# Patient Record
Sex: Female | Born: 1995
Health system: Southern US, Community
[De-identification: ages and names within clinical notes are randomized; demographics above are authoritative.]

## PROBLEM LIST (undated history)

## (undated) DIAGNOSIS — D649 Anemia, unspecified: Secondary | ICD-10-CM

## (undated) DIAGNOSIS — I10 Essential (primary) hypertension: Secondary | ICD-10-CM

## (undated) DIAGNOSIS — R87619 Unspecified abnormal cytological findings in specimens from cervix uteri: Secondary | ICD-10-CM

## (undated) DIAGNOSIS — O24419 Gestational diabetes mellitus in pregnancy, unspecified control: Secondary | ICD-10-CM

## (undated) DIAGNOSIS — F4310 Post-traumatic stress disorder, unspecified: Secondary | ICD-10-CM

## (undated) DIAGNOSIS — J45909 Unspecified asthma, uncomplicated: Secondary | ICD-10-CM

## (undated) DIAGNOSIS — R87629 Unspecified abnormal cytological findings in specimens from vagina: Secondary | ICD-10-CM

## (undated) HISTORY — DX: Unspecified abnormal cytological findings in specimens from cervix uteri: R87.619

## (undated) HISTORY — PX: NO PAST SURGERIES: SHX2092

## (undated) HISTORY — DX: Anemia, unspecified: D64.9

## (undated) HISTORY — DX: Post-traumatic stress disorder, unspecified: F43.10

---

## 2019-05-23 DIAGNOSIS — Z3202 Encounter for pregnancy test, result negative: Secondary | ICD-10-CM | POA: Diagnosis not present

## 2019-07-20 ENCOUNTER — Encounter (HOSPITAL_COMMUNITY): Payer: Self-pay | Admitting: Obstetrics & Gynecology

## 2019-07-20 ENCOUNTER — Inpatient Hospital Stay (HOSPITAL_COMMUNITY)
Admission: AD | Admit: 2019-07-20 | Discharge: 2019-07-21 | Disposition: A | Discharge status: Home / Self Care | Payer: BC Managed Care – PPO | Attending: Obstetrics & Gynecology | Admitting: Obstetrics & Gynecology

## 2019-07-20 ENCOUNTER — Other Ambulatory Visit: Payer: Self-pay

## 2019-07-20 ENCOUNTER — Inpatient Hospital Stay (HOSPITAL_COMMUNITY): Payer: BC Managed Care – PPO

## 2019-07-20 ENCOUNTER — Ambulatory Visit (INDEPENDENT_AMBULATORY_CARE_PROVIDER_SITE_OTHER)
Admission: EM | Admit: 2019-07-20 | Discharge: 2019-07-20 | Disposition: A | Discharge status: Home / Self Care | Payer: BC Managed Care – PPO | Source: Home / Self Care | Attending: Family Medicine | Admitting: Family Medicine

## 2019-07-20 ENCOUNTER — Encounter (HOSPITAL_COMMUNITY): Payer: Self-pay | Admitting: Family Medicine

## 2019-07-20 DIAGNOSIS — O26891 Other specified pregnancy related conditions, first trimester: Secondary | ICD-10-CM | POA: Diagnosis not present

## 2019-07-20 DIAGNOSIS — O209 Hemorrhage in early pregnancy, unspecified: Secondary | ICD-10-CM | POA: Diagnosis not present

## 2019-07-20 DIAGNOSIS — Z3A01 Less than 8 weeks gestation of pregnancy: Secondary | ICD-10-CM | POA: Insufficient documentation

## 2019-07-20 DIAGNOSIS — O3680X0 Pregnancy with inconclusive fetal viability, not applicable or unspecified: Secondary | ICD-10-CM

## 2019-07-20 DIAGNOSIS — Z3202 Encounter for pregnancy test, result negative: Secondary | ICD-10-CM | POA: Diagnosis not present

## 2019-07-20 DIAGNOSIS — R109 Unspecified abdominal pain: Secondary | ICD-10-CM | POA: Diagnosis not present

## 2019-07-20 DIAGNOSIS — O039 Complete or unspecified spontaneous abortion without complication: Secondary | ICD-10-CM | POA: Diagnosis not present

## 2019-07-20 DIAGNOSIS — N939 Abnormal uterine and vaginal bleeding, unspecified: Secondary | ICD-10-CM

## 2019-07-20 DIAGNOSIS — Z3A00 Weeks of gestation of pregnancy not specified: Secondary | ICD-10-CM | POA: Diagnosis not present

## 2019-07-20 DIAGNOSIS — R1030 Lower abdominal pain, unspecified: Secondary | ICD-10-CM | POA: Insufficient documentation

## 2019-07-20 DIAGNOSIS — O4690 Antepartum hemorrhage, unspecified, unspecified trimester: Secondary | ICD-10-CM | POA: Diagnosis not present

## 2019-07-20 LAB — CBC
HCT: 39 % (ref 36.0–46.0)
Hemoglobin: 12.9 g/dL (ref 12.0–15.0)
MCH: 28.9 pg (ref 26.0–34.0)
MCHC: 33.1 g/dL (ref 30.0–36.0)
MCV: 87.4 fL (ref 80.0–100.0)
Platelets: 275 10*3/uL (ref 150–400)
RBC: 4.46 MIL/uL (ref 3.87–5.11)
RDW: 13 % (ref 11.5–15.5)
WBC: 7.7 10*3/uL (ref 4.0–10.5)
nRBC: 0 % (ref 0.0–0.2)

## 2019-07-20 LAB — HCG, QUANTITATIVE, PREGNANCY: hCG, Beta Chain, Quant, S: 16 m[IU]/mL — ABNORMAL HIGH (ref <5)

## 2019-07-20 LAB — POC URINE PREG, ED: Preg Test, Ur: NEGATIVE

## 2019-07-20 LAB — ABO/RH: ABO/RH(D): A POS

## 2019-07-20 NOTE — ED Triage Notes (Signed)
Pt c/o vaginal bleeding. + pregnancy test x 4 at home.

## 2019-07-20 NOTE — MAU Note (Addendum)
Found out last wk I was pregnant. Having some abd pain since last wk. Today also having back pain and pink/red spotting. Was seen at [facility] this evening. UPT negative and BHCG 16.

## 2019-07-20 NOTE — ED Provider Notes (Signed)
[facility]    CSN: [id number] Arrival date & time: 07/20/19  1813      History   Chief Complaint Chief Complaint  Patient presents with  . Vaginal Bleeding    HPI Cindy Fletcher is a 24 y.o. female.   She is she is presenting with vaginal bleeding.  It has been light in nature.  She has taken pregnancy test at home which have been positive.  She is having some mild discomfort in the suprapubic region.  She denies any change in bowel movements or voiding.  No prior history of ectopic pregnancy.  HPI  History reviewed. No pertinent past medical history.  There are no problems to display for this patient.   History reviewed. No pertinent surgical history.  OB History   No obstetric history on file.      Home Medications    Prior to Admission medications   Not on File    Family History History reviewed. No pertinent family history.  Social History Social History   Tobacco Use  . Smoking status: Not on file  Substance Use Topics  . Alcohol use: Not on file  . Drug use: Not on file     Allergies   Patient has no known allergies.   Review of Systems Review of Systems  See HPI  Physical Exam Triage Vital Signs ED Triage Vitals [07/20/19 1831]  Enc Vitals Group     BP (!) 152/89     Pulse Rate 82     Resp 16     Temp 98 F (36.7 C)     Temp src      SpO2 100 %     Weight      Height      Head Circumference      Peak Flow      Pain Score 1     Pain Loc      Pain Edu?      Excl. in GC?    No data found.  Updated Vital Signs BP (!) 152/89   Pulse 82   Temp 98 F (36.7 C)   Resp 16   LMP 06/15/2019   SpO2 100%   Visual Acuity Right Eye Distance:   Left Eye Distance:   Bilateral Distance:    Right Eye Near:   Left Eye Near:    Bilateral Near:     Physical Exam Gen: NAD, alert, cooperative with exam, well-appearing ENT: normal lips, normal nasal mucosa,  Eye: normal EOM, normal conjunctiva and lids CV:  no edema,  +2 pedal pulses   Resp: no accessory muscle use, non-labored,  GI: no masses or tenderness, no hernia soft, nondistended, Skin: no rashes, no areas of induration     UC Treatments / Results  Labs (all labs ordered are listed, but only abnormal results are displayed) Labs Reviewed  HCG, QUANTITATIVE, PREGNANCY - Abnormal; Notable for the following components:      Result Value   hCG, Beta Chain, Quant, S 16 (*)    All other components within normal limits  POC URINE PREG, ED    EKG   Radiology No results found.  Procedures Procedures (including critical care time)  Medications Ordered in UC Medications - No data to display  Initial Impression / Assessment and Plan / UC Course  I have reviewed the triage vital signs and the nursing notes.  Pertinent labs & imaging results that were available during my care of the patient were reviewed by  me and considered in my medical decision making (see chart for details).     Yevonne is a 24 year old that is presenting with vaginal bleeding in the first trimester pregnancy.  Initial pregnancy test here was negative.  Quantitative was demonstrating positivity.  She was called and informed to be seen in the MAU for the consideration of transvaginal ultrasound.  Due to the concern of ectopic pregnancy.  She was agreeable with this.  Final Clinical Impressions(s) / UC Diagnoses   Final diagnoses:  Vaginal bleeding     Discharge Instructions     I will call you with the results from tonight.     ED Prescriptions    None     PDMP not reviewed this encounter.   Rosemarie Ax, MD 07/20/19 2046

## 2019-07-20 NOTE — MAU Note (Signed)
Wynelle Bourgeois CNM talking with pt to discuss plan of care. Will do labs to determine ABO RH and then pt will have u/s. Pt agrees with POC

## 2019-07-20 NOTE — Discharge Instructions (Signed)
I will call you with the results from tonight.

## 2019-07-21 DIAGNOSIS — O3680X0 Pregnancy with inconclusive fetal viability, not applicable or unspecified: Secondary | ICD-10-CM | POA: Diagnosis not present

## 2019-07-21 DIAGNOSIS — O4690 Antepartum hemorrhage, unspecified, unspecified trimester: Secondary | ICD-10-CM

## 2019-07-21 DIAGNOSIS — O26891 Other specified pregnancy related conditions, first trimester: Secondary | ICD-10-CM

## 2019-07-21 DIAGNOSIS — Z3A00 Weeks of gestation of pregnancy not specified: Secondary | ICD-10-CM

## 2019-07-21 DIAGNOSIS — R109 Unspecified abdominal pain: Secondary | ICD-10-CM

## 2019-07-21 LAB — URINALYSIS, ROUTINE W REFLEX MICROSCOPIC
Bacteria, UA: NONE SEEN
Bilirubin Urine: NEGATIVE
Glucose, UA: NEGATIVE mg/dL
Ketones, ur: NEGATIVE mg/dL
Leukocytes,Ua: NEGATIVE
Nitrite: NEGATIVE
Protein, ur: NEGATIVE mg/dL
Specific Gravity, Urine: 1.02 (ref 1.005–1.030)
pH: 5 (ref 5.0–8.0)

## 2019-07-21 NOTE — MAU Note (Signed)
Wynelle Bourgeois CNM in Triage to discuss u/s results and poc with pt. PT will return Friday evening for repeat BHCG. [place]itten and verbal dc instructions given and pt voiced understanding

## 2019-07-21 NOTE — Discharge Instructions (Signed)

## 2019-07-21 NOTE — MAU Provider Note (Signed)
Chief Complaint: Vaginal Bleeding, Abdominal Pain, and Back Pain   Seen by provider at 2250      SUBJECTIVE HPI: Cindy Fletcher is a 24 y.o. G1P0 at Unknown by LMP who presents to maternity admissions reporting lower abdominal and back pain since last week.  Had some light red spotting today. .Just recently found out she was pregnant.  Was seen in Urgent Care today and sent here for [place].  She denies vaginal itching/burning, urinary symptoms, h/a, dizziness, n/v, or fever/chills.    Vaginal Bleeding The patient's primary symptoms include pelvic pain and vaginal bleeding. The patient's pertinent negatives include no genital itching, genital lesions or genital odor. This is a new problem. The current episode started in the past 7 days. The problem has been unchanged. The pain is mild. She is pregnant. Associated symptoms include abdominal pain and back pain. Pertinent negatives include no constipation, diarrhea, dysuria, fever or frequency. The vaginal discharge was bloody. The vaginal bleeding is spotting. She has not been passing clots. She has not been passing tissue. Nothing aggravates the symptoms. She has tried nothing for the symptoms.  Abdominal Pain This is a new problem. The current episode started in the past 7 days. The onset quality is gradual. The pain is located in the suprapubic region. The quality of the pain is cramping. Pertinent negatives include no constipation, diarrhea, dysuria, fever or frequency. Nothing aggravates the pain. The pain is relieved by nothing. She has tried nothing for the symptoms.  Back Pain This is a new problem. The current episode started in the past 7 days. The problem is unchanged. The pain is present in the lumbar spine. Associated symptoms include abdominal pain and pelvic pain. Pertinent negatives include no dysuria or fever. She has tried nothing for the symptoms.   RN Note: Found out last wk I was pregnant. Having some abd pain since last wk. Today  also having back pain and pink/red spotting. Was seen at [facility] this evening. UPT negative and BHCG 16.   No past medical history on file. No past surgical history on file. Social History   Socioeconomic History  . Marital status: Single    Spouse name: Not on file  . Number of children: Not on file  . Years of education: Not on file  . Highest education level: Not on file  Occupational History  . Not on file  Tobacco Use  . Smoking status: Not on file  Substance and Sexual Activity  . Alcohol use: Not on file  . Drug use: Not on file  . Sexual activity: Not on file  Other Topics Concern  . Not on file  Social History Narrative  . Not on file   Social Determinants of Health   Financial Resource Strain:   . Difficulty of Paying Living Expenses:   Food Insecurity:   . Worried About Charity fundraiser in the Last Year:   . Arboriculturist in the Last Year:   Transportation Needs:   . Film/video editor (Medical):   Marland Kitchen Lack of Transportation (Non-Medical):   Physical Activity:   . Days of Exercise per Week:   . Minutes of Exercise per Session:   Stress:   . Feeling of Stress :   Social Connections:   . Frequency of Communication with Friends and Family:   . Frequency of Social Gatherings with Friends and Family:   . Attends Religious Services:   . Active Member of Clubs or Organizations:   .  Attends Banker Meetings:   Marland Kitchen Marital Status:   Intimate Partner Violence:   . Fear of Current or Ex-Partner:   . Emotionally Abused:   Marland Kitchen Physically Abused:   . Sexually Abused:    No current facility-administered medications on file prior to encounter.   No current outpatient medications on file prior to encounter.   No Known Allergies  I have reviewed patient's Past Medical Hx, Surgical Hx, Family Hx, Social Hx, medications and allergies.   ROS:  Review of Systems  Constitutional: Negative for fever.  Gastrointestinal: Positive for abdominal  pain. Negative for constipation and diarrhea.  Genitourinary: Positive for pelvic pain and vaginal bleeding. Negative for dysuria and frequency.  Musculoskeletal: Positive for back pain.   Review of Systems  Other systems negative   Physical Exam  Physical Exam Patient Vitals for the past 24 hrs:  BP Temp Pulse Resp Height Weight  07/20/19 2245 140/79 98.6 F (37 C) 68 16 5' 8.5" (1.74 m) 103 kg   Constitutional: Well-developed, well-nourished female in no acute distress.  Cardiovascular: normal rate Respiratory: normal effort GI: Abd soft, non-tender. Pos BS x 4 MS: Extremities nontender, no edema, normal ROM Neurologic: Alert and oriented x 4.  GU: Neg CVAT.  PELVIC EXAM: Deferred, seen at Urgent Care  LAB RESULTS Results for orders placed or performed during the hospital encounter of 07/20/19 (from the past 24 hour(s))  Urinalysis, Routine w reflex microscopic     Status: Abnormal   Collection Time: 07/20/19 11:03 PM  Result Value Ref Range   Color, Urine YELLOW YELLOW   APPearance CLEAR CLEAR   Specific Gravity, Urine 1.020 1.005 - 1.030   pH 5.0 5.0 - 8.0   Glucose, UA NEGATIVE NEGATIVE mg/dL   Hgb urine dipstick MODERATE (A) NEGATIVE   Bilirubin Urine NEGATIVE NEGATIVE   Ketones, ur NEGATIVE NEGATIVE mg/dL   Protein, ur NEGATIVE NEGATIVE mg/dL   Nitrite NEGATIVE NEGATIVE   Leukocytes,Ua NEGATIVE NEGATIVE   RBC / HPF 0-5 0 - 5 RBC/hpf   WBC, UA 0-5 0 - 5 WBC/hpf   Bacteria, UA NONE SEEN NONE SEEN   Squamous Epithelial / LPF 0-5 0 - 5   Mucus PRESENT   CBC     Status: None   Collection Time: 07/20/19 11:30 PM  Result Value Ref Range   WBC 7.7 4.0 - 10.5 K/uL   RBC 4.46 3.87 - 5.11 MIL/uL   Hemoglobin 12.9 12.0 - 15.0 g/dL   HCT 03.7 04.8 - 88.9 %   MCV 87.4 80.0 - 100.0 fL   MCH 28.9 26.0 - 34.0 pg   MCHC 33.1 30.0 - 36.0 g/dL   RDW 16.9 45.0 - 38.8 %   Platelets 275 150 - 400 K/uL   nRBC 0.0 0.0 - 0.2 %  ABO/Rh     Status: None   Collection Time:  07/20/19 11:30 PM  Result Value Ref Range   ABO/RH(D) A POS    No rh immune globuloin      NOT A RH IMMUNE GLOBULIN CANDIDATE, PT RH POSITIVE Performed at [facility], [address], [place], [place] [address]    Quantitative HCG = 16 (Urgent Care)  --/--/A POS (05/05 2330)  IMAGING US OB LESS THAN 14 WEEKS WITH OB TRANSVAGINAL  Result Date: 07/21/2019 CLINICAL DATA:  Initial evaluation for acute vaginal bleeding, positive beta HCG. EXAM: OBSTETRIC <14 WK [place] AND TRANSVAGINAL OB US TECHNIQUE: Both transabdominal and transvaginal ultrasound examinations were performed  for complete evaluation of the gestation as well as the maternal uterus, adnexal regions, and pelvic cul-de-sac. Transvaginal technique was performed to assess early pregnancy. COMPARISON:  None. FINDINGS: Intrauterine gestational sac: Negative. Endometrial stripe measures 6 mm. Yolk sac:  Negative. Embryo:  Negative. Cardiac Activity: Negative. Heart Rate: N/A  bpm Subchorionic hemorrhage:  None visualized. Maternal uterus/adnexae: Ovaries are normal in appearance bilaterally. No adnexal mass or free fluid. IMPRESSION: 1. Early pregnancy with no discrete IUP or adnexal mass identified. Finding is consistent with a pregnancy of unknown anatomic location. Differential considerations include IUP to early to visualize, recent SAB, or possibly occult ectopic pregnancy. Close clinical monitoring with serial beta HCGs and close interval follow-up ultrasound recommended as clinically warranted. 2. No other acute maternal uterine or adnexal abnormality identified. Electronically Signed   By: Rise Mu M.D.   On: 07/21/2019 00:11    MAU Management/MDM: Ordered usual first trimester r/o ectopic labs.   Pelvic exam and cultures done at Urgent Care Will check baseline Ultrasound to rule out ectopic.  This bleeding/pain can represent a normal pregnancy with bleeding, spontaneous abortion or even an ectopic which can be  life-threatening.  The process as listed above helps to determine which of these is present.  Reviewed results with patient.  Discussed that with a low HCG, we would not expect to see anything on [place]. Thus we cannot rule out ectopic pregnancy just yet Recommend repeat HCG in 48 hours to see if it rises or fall.  Suspect that since she had positive UPT earlier and now is negative (because HCG is 16), this likely represents a failed pregnancy, but we cant be sure until we repeat the HCG   ASSESSMENT 1. Pregnancy of unknown anatomic location   2. Vaginal bleeding in pregnancy, first trimester     PLAN Discharge home Plan to repeat HCG level in 48 hours in MAU Will repeat  Ultrasound in about 7-10 days if HCG levels double appropriately  Ectopic precautions  Follow-up Information    [facility]. Go in 2 day(s).   Specialty: Obstetrics and Gynecology Contact information: [address] [id number] Wilhemina Bonito [place] [address] [phone]         Pt stable at time of discharge. Encouraged to return here or to other Urgent Care/ED if she develops worsening of symptoms, increase in pain, fever, or other concerning symptoms.    Wynelle Bourgeois CNM, MSN Certified Nurse-Midwife 07/21/2019  1:21 AM

## 2019-07-22 ENCOUNTER — Inpatient Hospital Stay (HOSPITAL_COMMUNITY)
Admission: AD | Admit: 2019-07-22 | Discharge: 2019-07-22 | Disposition: A | Discharge status: Home / Self Care | Payer: BC Managed Care – PPO | Attending: Obstetrics and Gynecology | Admitting: Obstetrics and Gynecology

## 2019-07-22 ENCOUNTER — Other Ambulatory Visit: Payer: Self-pay

## 2019-07-22 DIAGNOSIS — Z6790 Unspecified blood type, Rh positive: Secondary | ICD-10-CM | POA: Insufficient documentation

## 2019-07-22 DIAGNOSIS — O039 Complete or unspecified spontaneous abortion without complication: Secondary | ICD-10-CM | POA: Diagnosis not present

## 2019-07-22 LAB — HCG, QUANTITATIVE, PREGNANCY: hCG, Beta Chain, Quant, S: 5 m[IU]/mL — ABNORMAL HIGH (ref <5)

## 2019-07-22 NOTE — MAU Provider Note (Signed)
Subjective:  Cindy Fletcher is a 24 y.o. G1P0 at Unknown who presents today for FU BHCG. She was seen on 07/21/2019 around 1AM, but had blood drawn for hCG on 07/20/2019 @1902 . Results from that day show no IUP on , and HCG 16. Patient had a negative UPT at [facility] that day. She reports minimal vaginal bleeding. She denies abdominal or pelvic pain.   Objective:  Physical Exam  Nursing note and vitals reviewed.  Patient Vitals for the past 24 hrs:  BP Temp Temp src Pulse Resp SpO2 Height Weight  07/22/19 1841 (!) 146/57 98.4 F (36.9 C) Oral 73 16 100 % 5' 8.5" (1.74 m) 102.9 kg   Constitutional: She is oriented to person, place, and time. She appears well-developed and well-nourished. No distress.  HENT:  Head: Normocephalic.  Cardiovascular: Normal rate.  Respiratory: Effort normal.  GI: Soft. There is no tenderness.  Neurological: She is alert and oriented to person, place, and time. Skin: Skin is warm and dry.  Psychiatric: She has a normal mood and affect.   Results for orders placed or performed during the hospital encounter of 07/22/19 (from the past 24 hour(s))  hCG, quantitative, pregnancy     Status: Abnormal   Collection Time: 07/22/19  5:13 PM  Result Value Ref Range   hCG, Beta Chain, Quant, S 5 (H) <5 mIU/mL   Assessment/Plan: Miscarriage RH Positive HCG did not rise appropriately FU in 1 weeks for: repeat hCG and provider visit Message sent to [facility] to schedule Return MAU precautions given Pt discharged to home in stable condition  , 09/21/19, NP  6:58 PM 07/22/2019

## 2019-07-22 NOTE — Discharge Instructions (Signed)
Managing Pregnancy Loss Pregnancy loss can happen any time during a pregnancy. Often the cause is not known. It is rarely because of anything you did. Pregnancy loss in early pregnancy (during the first trimester) is called a miscarriage. This type of pregnancy loss is the most common. Pregnancy loss that happens after 20 weeks of pregnancy is called fetal demise if the baby's heart stops beating before birth. Fetal demise is much less common. Some women experience spontaneous labor shortly after fetal demise resulting in a stillborn birth (stillbirth). Any pregnancy loss can be devastating. You will need to recover both physically and emotionally. Most women are able to get pregnant again after a pregnancy loss and deliver a healthy baby. How to manage emotional recovery  Pregnancy loss is very hard emotionally. You may feel many different emotions while you grieve. You may feel sad and angry. You may also feel guilty. It is normal to have periods of crying. Emotional recovery can take longer than physical recovery. It is different for everyone. Taking these steps can help you in managing this loss:  Remember that it is unlikely you did anything to cause the pregnancy loss.  Share your thoughts and feelings with friends, family, and your partner. Remember that your partner is also recovering emotionally.  Make sure you have a good support system. Do not spend too much time alone.  Meet with a pregnancy loss counselor or join a pregnancy loss support group.  Get enough sleep and eat a healthy diet. Return to regular exercise when you have recovered physically.  Do not use drugs or alcohol to manage your emotions.  Consider seeing a mental health professional to help you recover emotionally.  Ask a friend or loved one to help you decide what to do with any clothing and nursery items you received for your baby. In the case of a stillbirth, many women benefit from taking additional steps in the  grieving process. You may want to:  Hold your baby after the birth.  Name your baby.  Request a birth certificate.  Create a keepsake such as handprints or footprints.  Dress your baby and have a picture taken.  Make funeral arrangements.  Ask for a baptism or blessing. Hospitals have staff members who can help you with all these arrangements. How to recognize emotional stress It is normal to have emotional stress after a pregnancy loss. But emotional stress that lasts a long time or becomes severe requires treatment. Watch out for these signs of severe emotional stress:  Sadness, anger, or guilt that is not going away and is interfering with your normal activities.  Relationship problems that have occurred or gotten worse since the pregnancy loss.  Signs of depression that last longer than 2 weeks. These may include: ? Sadness. ? Anxiety. ? Hopelessness. ? Loss of interest in activities you enjoy. ? Inability to concentrate. ? Trouble sleeping or sleeping too much. ? Loss of appetite or overeating. ? Thoughts of death or of hurting yourself. Follow these instructions at home:  Take over-the-counter and prescription medicines only as told by your health care provider.  Rest at home until your energy level returns. Return to your normal activities as told by your health care provider. Ask your health care provider what activities are safe for you.  When you are ready, meet with your health care provider to discuss steps to take for a future pregnancy.  Keep all follow-up visits as told by your health care provider. This is important.  Where to find support  To help you and your partner with the process of grieving, talk with your health care provider or seek counseling.  Consider meeting with others who have experienced pregnancy loss. Ask your health care provider about support groups and resources. Where to find more information  U.S. Department of Health and Holiday representative on Women's Health: [place]s.[place]  American Pregnancy Association: www.americanpregnancy.org Contact a health care provider if:  You continue to experience grief, sadness, or lack of motivation for everyday activities, and those feelings do not improve over time.  You are struggling to recover emotionally, especially if you are using alcohol or substances to help. Get help right away if:  You have thoughts of hurting yourself or others. If you ever feel like you may hurt yourself or others, or have thoughts about taking your own life, get help right away. You can go to your nearest emergency department or call:  Your local emergency services (911 in the [place]  A suicide crisis helpline, such as the [facility] at 541-055-2195. This is open 24 hours a day. Summary  Any pregnancy loss can be difficult physically and emotionally.  You may experience many different emotions while you grieve. Emotional recovery can last longer than physical recovery.  It is normal to have emotional stress after a pregnancy loss. But emotional stress that lasts a long time or becomes severe requires treatment.  See your health care provider if you are struggling emotionally after a pregnancy loss. This information is not intended to replace advice given to you by your health care provider. Make sure you discuss any questions you have with your health care provider. Document Revised: 06/23/2018 Document Reviewed: 05/14/2017 Elsevier Patient Education  2020 [place] A miscarriage is the loss of an unborn baby (fetus) before the 20th week of pregnancy. Most miscarriages happen during the first 3 months of pregnancy. Sometimes, a miscarriage can happen before a woman knows that she is pregnant. Having a miscarriage can be an emotional experience. If you have had a miscarriage, talk with your health care provider about any questions you  may have about miscarrying, the grieving process, and your plans for future pregnancy. What are the causes? A miscarriage may be caused by:  Problems with the genes or chromosomes of the fetus. These problems make it impossible for the baby to develop normally. They are often the result of random errors that occur early in the development of the baby, and are not passed from parent to child (not inherited).  Infection of the cervix or uterus.  Conditions that affect hormone balance in the body.  Problems with the cervix, such as the cervix opening and thinning before pregnancy is at term (cervical insufficiency).  Problems with the uterus. These may include: ? A uterus with an abnormal shape. ? Fibroids in the uterus. ? Congenital abnormalities. These are problems that were present at birth.  Certain medical conditions.  Smoking, drinking alcohol, or using drugs.  Injury (trauma). In many cases, the cause of a miscarriage is not known. What are the signs or symptoms? Symptoms of this condition include:  Vaginal bleeding or spotting, with or without cramps or pain.  Pain or cramping in the abdomen or lower back.  Passing fluid, tissue, or blood clots from the vagina. How is this diagnosed? This condition may be diagnosed based on:  A physical exam.  Ultrasound.  Blood tests.  Urine tests. How  is this treated? Treatment for a miscarriage is sometimes not necessary if you naturally pass all the tissue that was in your uterus. If necessary, this condition may be treated with:  Dilation and curettage (D&C). This is a procedure in which the cervix is stretched open and the lining of the uterus (endometrium) is scraped. This is done only if tissue from the fetus or placenta remains in the body (incomplete miscarriage).  Medicines, such as: ? Antibiotic medicine, to treat infection. ? Medicine to help the body pass any remaining tissue. ? Medicine to reduce (contract) the  size of the uterus. These medicines may be given if you have a lot of bleeding. If you have Rh negative blood and your baby was Rh positive, you will need a shot of a medicine called Rh immunoglobulinto protect your future babies from Rh blood problems. "Rh-negative" and "Rh-positive" refer to whether or not the blood has a specific protein found on the surface of red blood cells (Rh factor). Follow these instructions at home: Medicines   Take over-the-counter and prescription medicines only as told by your health care provider.  If you were prescribed antibiotic medicine, take it as told by your health care provider. Do not stop taking the antibiotic even if you start to feel better.  Do not take NSAIDs, such as aspirin and ibuprofen, unless they are approved by your health care provider. These medicines can cause bleeding. Activity  Rest as directed. Ask your health care provider what activities are safe for you.  Have someone help with home and family responsibilities during this time. General instructions  Keep track of the number of sanitary pads you use each day and how soaked (saturated) they are. Write down this information.  Monitor the amount of tissue or blood clots that you pass from your vagina. Save any large amounts of tissue for your health care provider to examine.  Do not use tampons, douche, or have sex until your health care provider approves.  To help you and your partner with the process of grieving, talk with your health care provider or seek counseling.  When you are ready, meet with your health care provider to discuss any important steps you should take for your health. Also, discuss steps you should take to have a healthy pregnancy in the future.  Keep all follow-up visits as told by your health care provider. This is important. Where to find more information  The American Congress of Obstetricians and Gynecologists: www.acog.org  U.S. Department of Health  and Cytogeneticist of Women's Health: http://hoffman.com/ Contact a health care provider if:  You have a fever or chills.  You have a foul smelling vaginal discharge.  You have more bleeding instead of less. Get help right away if:  You have severe cramps or pain in your back or abdomen.  You pass blood clots or tissue from your vagina that is walnut-sized or larger.  You soak more than 1 regular sanitary pad in an hour.  You become light-headed or weak.  You pass out.  You have feelings of sadness that take over your thoughts, or you have thoughts of hurting yourself. Summary  Most miscarriages happen in the first 3 months of pregnancy. Sometimes miscarriage happens before a woman even knows that she is pregnant.  Follow your health care provider's instruction for home care. Keep all follow-up appointments.  To help you and your partner with the process of grieving, talk with your health care provider or seek  counseling. This information is not intended to replace advice given to you by your health care provider. Make sure you discuss any questions you have with your health care provider. Document Revised: 06/25/2018 Document Reviewed: 04/08/2016 Elsevier Patient Education  [place].

## 2019-07-22 NOTE — MAU Note (Signed)
Here for repeat hormone level.  Had more bleeding yesterday, today has been minimal.  Denies pain.  Discussed hormone level and what it means, what we are looking for.

## 2019-08-01 ENCOUNTER — Ambulatory Visit: Payer: BC Managed Care – PPO | Admitting: Advanced Practice Midwife

## 2019-08-01 ENCOUNTER — Other Ambulatory Visit: Payer: Self-pay

## 2019-08-01 ENCOUNTER — Ambulatory Visit (INDEPENDENT_AMBULATORY_CARE_PROVIDER_SITE_OTHER): Payer: BC Managed Care – PPO | Admitting: Advanced Practice Midwife

## 2019-08-01 ENCOUNTER — Encounter: Payer: Self-pay | Admitting: Advanced Practice Midwife

## 2019-08-01 ENCOUNTER — Other Ambulatory Visit: Payer: BC Managed Care – PPO

## 2019-08-01 VITALS — BP 137/85 | HR 88 | Wt 229.0 lb

## 2019-08-01 DIAGNOSIS — O039 Complete or unspecified spontaneous abortion without complication: Secondary | ICD-10-CM | POA: Insufficient documentation

## 2019-08-01 NOTE — Progress Notes (Signed)
Pt states she does not want BC at this time.  Pt would like to know appropriate time to conceive.

## 2019-08-01 NOTE — Patient Instructions (Signed)
Managing Pregnancy Loss Pregnancy loss can happen any time during a pregnancy. Often the cause is not known. It is rarely because of anything you did. Pregnancy loss in early pregnancy (during the first trimester) is called a miscarriage. This type of pregnancy loss is the most common. Pregnancy loss that happens after 20 weeks of pregnancy is called fetal demise if the baby's heart stops beating before birth. Fetal demise is much less common. Some women experience spontaneous labor shortly after fetal demise resulting in a stillborn birth (stillbirth). Any pregnancy loss can be devastating. You will need to recover both physically and emotionally. Most women are able to get pregnant again after a pregnancy loss and deliver a healthy baby. How to manage emotional recovery  Pregnancy loss is very hard emotionally. You may feel many different emotions while you grieve. You may feel sad and angry. You may also feel guilty. It is normal to have periods of crying. Emotional recovery can take longer than physical recovery. It is different for everyone. Taking these steps can help you in managing this loss:  Remember that it is unlikely you did anything to cause the pregnancy loss.  Share your thoughts and feelings with friends, family, and your partner. Remember that your partner is also recovering emotionally.  Make sure you have a good support system. Do not spend too much time alone.  Meet with a pregnancy loss counselor or join a pregnancy loss support group.  Get enough sleep and eat a healthy diet. Return to regular exercise when you have recovered physically.  Do not use drugs or alcohol to manage your emotions.  Consider seeing a mental health professional to help you recover emotionally.  Ask a friend or loved one to help you decide what to do with any clothing and nursery items you received for your baby. In the case of a stillbirth, many women benefit from taking additional steps in the  grieving process. You may want to:  Hold your baby after the birth.  Name your baby.  Request a birth certificate.  Create a keepsake such as handprints or footprints.  Dress your baby and have a picture taken.  Make funeral arrangements.  Ask for a baptism or blessing. Hospitals have staff members who can help you with all these arrangements. How to recognize emotional stress It is normal to have emotional stress after a pregnancy loss. But emotional stress that lasts a long time or becomes severe requires treatment. Watch out for these signs of severe emotional stress:  Sadness, anger, or guilt that is not going away and is interfering with your normal activities.  Relationship problems that have occurred or gotten worse since the pregnancy loss.  Signs of depression that last longer than 2 weeks. These may include: ? Sadness. ? Anxiety. ? Hopelessness. ? Loss of interest in activities you enjoy. ? Inability to concentrate. ? Trouble sleeping or sleeping too much. ? Loss of appetite or overeating. ? Thoughts of death or of hurting yourself. Follow these instructions at home:  Take over-the-counter and prescription medicines only as told by your health care provider.  Rest at home until your energy level returns. Return to your normal activities as told by your health care provider. Ask your health care provider what activities are safe for you.  When you are ready, meet with your health care provider to discuss steps to take for a future pregnancy.  Keep all follow-up visits as told by your health care provider. This is important.   Where to find support  To help you and your partner with the process of grieving, talk with your health care provider or seek counseling.  Consider meeting with others who have experienced pregnancy loss. Ask your health care provider about support groups and resources. Where to find more information  U.S. Department of Health and Human  Services Office on Women's Health: www.womenshealth.gov  American Pregnancy Association: www.americanpregnancy.org Contact a health care provider if:  You continue to experience grief, sadness, or lack of motivation for everyday activities, and those feelings do not improve over time.  You are struggling to recover emotionally, especially if you are using alcohol or substances to help. Get help right away if:  You have thoughts of hurting yourself or others. If you ever feel like you may hurt yourself or others, or have thoughts about taking your own life, get help right away. You can go to your nearest emergency department or call:  Your local emergency services (911 in the [place]  A suicide crisis helpline, such as the National Suicide Prevention Lifeline at 1-800-273-8255. This is open 24 hours a day. Summary  Any pregnancy loss can be difficult physically and emotionally.  You may experience many different emotions while you grieve. Emotional recovery can last longer than physical recovery.  It is normal to have emotional stress after a pregnancy loss. But emotional stress that lasts a long time or becomes severe requires treatment.  See your health care provider if you are struggling emotionally after a pregnancy loss. This information is not intended to replace advice given to you by your health care provider. Make sure you discuss any questions you have with your health care provider. Document Revised: 06/23/2018 Document Reviewed: 05/14/2017 Elsevier Patient Education  2020 Elsevier Inc.  

## 2019-08-01 NOTE — Progress Notes (Signed)
  GYNECOLOGY PROGRESS NOTE  History:  24 y.o. G1P0 presents to [facility] office today for problem gyn visit. She reports no pain and no bleeding.  She denies h/a, dizziness, shortness of breath, n/v, or fever/chills.    The following portions of the patient's history were reviewed and updated as appropriate: allergies, current medications, past family history, past medical history, past social history, past surgical history and problem list.   Review of Systems:  Pertinent items are noted in HPI.   Objective:  Physical Exam Blood pressure 137/85, pulse 88, weight 229 lb (103.9 kg), last menstrual period 06/15/2019, unknown if currently breastfeeding. VS reviewed, nursing note reviewed,  Constitutional: well developed, well nourished, no distress HEENT: normocephalic CV: normal rate Pulm/chest wall: normal effort Breast Exam: deferred Abdomen: soft Neuro: alert and oriented x 3 Skin: warm, dry Psych: affect normal Pelvic exam: Deferred  Assessment & Plan:  1. Miscarriage --Pt doing physically and emotionally well.   --Plans another pregnancy so does not desire contraception at this time --Hcg was 5 on 07/22/19, no further labwork needed. --F/U PRN  Sharen Counter, CNM 4:23 PM

## 2019-09-29 ENCOUNTER — Ambulatory Visit (INDEPENDENT_AMBULATORY_CARE_PROVIDER_SITE_OTHER): Payer: 59

## 2019-09-29 ENCOUNTER — Other Ambulatory Visit: Payer: Self-pay

## 2019-09-29 VITALS — BP 132/84 | HR 89 | Wt 223.9 lb

## 2019-09-29 DIAGNOSIS — Z3201 Encounter for pregnancy test, result positive: Secondary | ICD-10-CM

## 2019-09-29 DIAGNOSIS — Z3200 Encounter for pregnancy test, result unknown: Secondary | ICD-10-CM

## 2019-09-29 LAB — POCT URINE PREGNANCY: Preg Test, Ur: POSITIVE — AB

## 2019-09-29 MED ORDER — VITAFOL GUMMIES 3.33-0.333-34.8 MG PO CHEW
3.0000 | CHEWABLE_TABLET | Freq: Every day | ORAL | 12 refills | Status: DC
Start: 2019-09-29 — End: 2020-10-23

## 2019-09-29 MED ORDER — BLOOD PRESSURE CUFF MISC
1.0000 | 0 refills | Status: DC
Start: 2019-09-29 — End: 2020-05-22

## 2019-09-29 NOTE — Progress Notes (Signed)
Patient was assessed and managed by nursing staff during this encounter. I have reviewed the chart and agree with the documentation and plan. I have also made any necessary editorial changes.  Warden Fillers, MD 09/29/2019 1:37 PM

## 2019-09-29 NOTE — Progress Notes (Signed)
Pt is here for UPT. UPT is positive. LMP 08/26/19, EDD 06/01/20. Advised pt that it is too early for [place] today, we will bring her back between 8-9 weeks for [place] for dating then schedule her new OB appt. Pt voices understanding.

## 2019-10-21 ENCOUNTER — Other Ambulatory Visit: Payer: Self-pay

## 2019-10-21 ENCOUNTER — Ambulatory Visit (INDEPENDENT_AMBULATORY_CARE_PROVIDER_SITE_OTHER): Payer: 59

## 2019-10-21 ENCOUNTER — Ambulatory Visit (INDEPENDENT_AMBULATORY_CARE_PROVIDER_SITE_OTHER): Payer: 59 | Admitting: Obstetrics and Gynecology

## 2019-10-21 VITALS — BP 147/85 | HR 101 | Ht 68.5 in | Wt 225.2 lb

## 2019-10-21 DIAGNOSIS — Z3687 Encounter for antenatal screening for uncertain dates: Secondary | ICD-10-CM | POA: Diagnosis not present

## 2019-10-21 DIAGNOSIS — O3680X0 Pregnancy with inconclusive fetal viability, not applicable or unspecified: Secondary | ICD-10-CM

## 2019-10-21 DIAGNOSIS — Z3A08 8 weeks gestation of pregnancy: Secondary | ICD-10-CM

## 2019-10-21 NOTE — Progress Notes (Signed)
  DATING AND VIABILITY SONOGRAM   Cindy Fletcher is a 24 y.o. year old G2P0010 with LMP Patient's last menstrual period was 08/26/2019. which would correlate to  [redacted]w[redacted]d weeks gestation.  She has regular menstrual cycles.   She is here today for a confirmatory initial sonogram.    GESTATION: [redacted]w[redacted]d SINGLETON     FETAL ACTIVITY:          Heart rate         176          The fetus is inactive.  Gestational criteria: Estimated Date of Delivery: 06/01/20 by early ultrasound now at [redacted]w[redacted]d  Previous Scans:0  GESTATIONAL SAC           36.4         [redacted]w[redacted]d  CROWN RUMP LENGTH           16         [redacted]w[redacted]d                                                                               AVERAGE EGA(BY THIS SCAN):  8wod by CRL   WORKING EDD( early ultrasound ):  06/01/20     TECHNICIAN COMMENTS:  Single live IUP at [redacted]w[redacted]d by CRL. FHR 176   A copy of this report including all images has been saved and backed up to a second source for retrieval if needed. All measures and details of the anatomical scan, placentation, fluid volume and pelvic anatomy are contained in that report.  Kadesia Robel J Sadia Belfiore 10/21/2019 9:32 AM

## 2019-10-21 NOTE — Progress Notes (Unsigned)
DATING AND VIABILITY SONOGRAM   Cindy Fletcher is a 24 y.o. year old G2P0010 with LMP Patient's last menstrual period was 08/26/2019. which would correlate to  [redacted]w[redacted]d weeks gestation.  She has regular menstrual cycles.   She is here today for a confirmatory initial sonogram.    GESTATION: [redacted]w[redacted]d SINGLETON     FETAL ACTIVITY:          Heart rate         176          The fetus is inactive.  Gestational criteria: Estimated Date of Delivery: 06/01/20 by early ultrasound now at [redacted]w[redacted]d  Previous Scans:0  GESTATIONAL SAC           36.4         [redacted]w[redacted]d  CROWN RUMP LENGTH           16         [redacted]w[redacted]d                                                                               AVERAGE EGA(BY THIS SCAN):  8wod by CRL   WORKING EDD( early ultrasound ):  06/01/20     TECHNICIAN COMMENTS:  Single live IUP at [redacted]w[redacted]d by CRL. FHR 176   A copy of this report including all images has been saved and backed up to a second source for retrieval if needed. All measures and details of the anatomical scan, placentation, fluid volume and pelvic anatomy are contained in that report.  Cindy Fletcher J Cindy Fletcher 10/21/2019 9:32 AM

## 2019-10-21 NOTE — Progress Notes (Signed)
Patient was assessed and managed by nursing staff during this encounter. I have reviewed the chart and agree with the documentation and plan. I have also made any necessary editorial changes.  Cindy Fletcher A Rameses Ou, MD 10/21/2019 10:16 AM   

## 2019-10-21 NOTE — Progress Notes (Signed)
Patient was assessed and managed by nursing staff during this encounter. I have reviewed the chart and agree with the documentation and plan. I have also made any necessary editorial changes.  Ephriam Turman A Aracelie Addis, MD 10/21/2019 10:16 AM   

## 2019-11-07 ENCOUNTER — Encounter: Payer: Self-pay | Admitting: Obstetrics

## 2019-11-07 ENCOUNTER — Encounter: Payer: Self-pay | Admitting: Advanced Practice Midwife

## 2019-11-07 ENCOUNTER — Ambulatory Visit (INDEPENDENT_AMBULATORY_CARE_PROVIDER_SITE_OTHER): Payer: BC Managed Care – PPO | Admitting: Advanced Practice Midwife

## 2019-11-07 ENCOUNTER — Other Ambulatory Visit: Payer: Self-pay

## 2019-11-07 ENCOUNTER — Other Ambulatory Visit (HOSPITAL_COMMUNITY)
Admission: RE | Admit: 2019-11-07 | Discharge: 2019-11-07 | Disposition: A | Discharge status: Home / Self Care | Payer: 59 | Source: Ambulatory Visit | Attending: Advanced Practice Midwife | Admitting: Advanced Practice Midwife

## 2019-11-07 VITALS — BP 129/82 | HR 94 | Wt 233.0 lb

## 2019-11-07 DIAGNOSIS — Z3480 Encounter for supervision of other normal pregnancy, unspecified trimester: Secondary | ICD-10-CM | POA: Diagnosis not present

## 2019-11-07 DIAGNOSIS — Z3A10 10 weeks gestation of pregnancy: Secondary | ICD-10-CM

## 2019-11-07 DIAGNOSIS — O219 Vomiting of pregnancy, unspecified: Secondary | ICD-10-CM

## 2019-11-07 DIAGNOSIS — N883 Incompetence of cervix uteri: Secondary | ICD-10-CM

## 2019-11-07 DIAGNOSIS — Z315 Encounter for genetic counseling: Secondary | ICD-10-CM | POA: Diagnosis not present

## 2019-11-07 DIAGNOSIS — Z3481 Encounter for supervision of other normal pregnancy, first trimester: Secondary | ICD-10-CM | POA: Diagnosis not present

## 2019-11-07 DIAGNOSIS — O0990 Supervision of high risk pregnancy, unspecified, unspecified trimester: Secondary | ICD-10-CM | POA: Insufficient documentation

## 2019-11-07 DIAGNOSIS — Z3490 Encounter for supervision of normal pregnancy, unspecified, unspecified trimester: Secondary | ICD-10-CM

## 2019-11-07 NOTE — Patient Instructions (Signed)
First Trimester of Pregnancy The first trimester of pregnancy is from week 1 until the end of week 13 (months 1 through 3). A week after a sperm fertilizes an egg, the egg will implant on the wall of the uterus. This embryo will begin to develop into a baby. Genes from you and your partner will form the baby. The female genes will determine whether the baby will be a boy or a girl. At 6-8 weeks, the eyes and face will be formed, and the heartbeat can be seen on ultrasound. At the end of 12 weeks, all the baby's organs will be formed. Now that you are pregnant, you will want to do everything you can to have a healthy baby. Two of the most important things are to get good prenatal care and to follow your health care provider's instructions. Prenatal care is all the medical care you receive before the baby's birth. This care will help prevent, find, and treat any problems during the pregnancy and childbirth. Body changes during your first trimester Your body goes through many changes during pregnancy. The changes vary from woman to woman.  You may gain or lose a couple of pounds at first.  You may feel sick to your stomach (nauseous) and you may throw up (vomit). If the vomiting is uncontrollable, call your health care provider.  You may tire easily.  You may develop headaches that can be relieved by medicines. All medicines should be approved by your health care provider.  You may urinate more often. Painful urination may mean you have a bladder infection.  You may develop heartburn as a result of your pregnancy.  You may develop constipation because certain hormones are causing the muscles that push stool through your intestines to slow down.  You may develop hemorrhoids or swollen veins (varicose veins).  Your breasts may begin to grow larger and become tender. Your nipples may stick out more, and the tissue that surrounds them (areola) may become darker.  Your gums may bleed and may be  sensitive to brushing and flossing.  Dark spots or blotches (chloasma, mask of pregnancy) may develop on your face. This will likely fade after the baby is born.  Your menstrual periods will stop.  You may have a loss of appetite.  You may develop cravings for certain kinds of food.  You may have changes in your emotions from day to day, such as being excited to be pregnant or being concerned that something may go wrong with the pregnancy and baby.  You may have more vivid and strange dreams.  You may have changes in your hair. These can include thickening of your hair, rapid growth, and changes in texture. Some women also have hair loss during or after pregnancy, or hair that feels dry or thin. Your hair will most likely return to normal after your baby is born. What to expect at prenatal visits During a routine prenatal visit:  You will be weighed to make sure you and the baby are growing normally.  Your blood pressure will be taken.  Your abdomen will be measured to track your baby's growth.  The fetal heartbeat will be listened to between weeks 10 and 14 of your pregnancy.  Test results from any previous visits will be discussed. Your health care provider may ask you:  How you are feeling.  If you are feeling the baby move.  If you have had any abnormal symptoms, such as leaking fluid, bleeding, severe headaches, or abdominal   cramping.  If you are using any tobacco products, including cigarettes, chewing tobacco, and electronic cigarettes.  If you have any questions. Other tests that may be performed during your first trimester include:  Blood tests to find your blood type and to check for the presence of any previous infections. The tests will also be used to check for low iron levels (anemia) and protein on red blood cells (Rh antibodies). Depending on your risk factors, or if you previously had diabetes during pregnancy, you may have tests to check for high blood sugar  that affects pregnant women (gestational diabetes).  Urine tests to check for infections, diabetes, or protein in the urine.  An ultrasound to confirm the proper growth and development of the baby.  Fetal screens for spinal cord problems (spina bifida) and Down syndrome.  HIV (human immunodeficiency virus) testing. Routine prenatal testing includes screening for HIV, unless you choose not to have this test.  You may need other tests to make sure you and the baby are doing well. Follow these instructions at home: Medicines  Follow your health care provider's instructions regarding medicine use. Specific medicines may be either safe or unsafe to take during pregnancy.  Take a prenatal vitamin that contains at least 600 micrograms (mcg) of folic acid.  If you develop constipation, try taking a stool softener if your health care provider approves. Eating and drinking   Eat a balanced diet that includes fresh fruits and vegetables, whole grains, good sources of protein such as meat, eggs, or tofu, and low-fat dairy. Your health care provider will help you determine the amount of weight gain that is right for you.  Avoid raw meat and uncooked cheese. These carry germs that can cause birth defects in the baby.  Eating four or five small meals rather than three large meals a day may help relieve nausea and vomiting. If you start to feel nauseous, eating a few soda crackers can be helpful. Drinking liquids between meals, instead of during meals, also seems to help ease nausea and vomiting.  Limit foods that are high in fat and processed sugars, such as fried and sweet foods.  To prevent constipation: ? Eat foods that are high in fiber, such as fresh fruits and vegetables, whole grains, and beans. ? Drink enough fluid to keep your urine clear or pale yellow. Activity  Exercise only as directed by your health care provider. Most women can continue their usual exercise routine during  pregnancy. Try to exercise for 30 minutes at least 5 days a week. Exercising will help you: ? Control your weight. ? Stay in shape. ? Be prepared for labor and delivery.  Experiencing pain or cramping in the lower abdomen or lower back is a good sign that you should stop exercising. Check with your health care provider before continuing with normal exercises.  Try to avoid standing for long periods of time. Move your legs often if you must stand in one place for a long time.  Avoid heavy lifting.  Wear low-heeled shoes and practice good posture.  You may continue to have sex unless your health care provider tells you not to. Relieving pain and discomfort  Wear a good support bra to relieve breast tenderness.  Take warm sitz baths to soothe any pain or discomfort caused by hemorrhoids. Use hemorrhoid cream if your health care provider approves.  Rest with your legs elevated if you have leg cramps or low back pain.  If you develop varicose veins in   your legs, wear support hose. Elevate your feet for 15 minutes, 3-4 times a day. Limit salt in your diet. Prenatal care  Schedule your prenatal visits by the twelfth week of pregnancy. They are usually scheduled monthly at first, then more often in the last 2 months before delivery.  Write down your questions. Take them to your prenatal visits.  Keep all your prenatal visits as told by your health care provider. This is important. Safety  Wear your seat belt at all times when driving.  Make a list of emergency phone numbers, including numbers for family, friends, the hospital, and police and fire departments. General instructions  Ask your health care provider for a referral to a local prenatal education class. Begin classes no later than the beginning of month 6 of your pregnancy.  Ask for help if you have counseling or nutritional needs during pregnancy. Your health care provider can offer advice or refer you to specialists for help  with various needs.  Do not use hot tubs, steam rooms, or saunas.  Do not douche or use tampons or scented sanitary pads.  Do not cross your legs for long periods of time.  Avoid cat litter boxes and soil used by cats. These carry germs that can cause birth defects in the baby and possibly loss of the fetus by miscarriage or stillbirth.  Avoid all smoking, herbs, alcohol, and medicines not prescribed by your health care provider. Chemicals in these products affect the formation and growth of the baby.  Do not use any products that contain nicotine or tobacco, such as cigarettes and e-cigarettes. If you need help quitting, ask your health care provider. You may receive counseling support and other resources to help you quit.  Schedule a dentist appointment. At home, brush your teeth with a soft toothbrush and be gentle when you floss. Contact a health care provider if:  You have dizziness.  You have mild pelvic cramps, pelvic pressure, or nagging pain in the abdominal area.  You have persistent nausea, vomiting, or diarrhea.  You have a bad smelling vaginal discharge.  You have pain when you urinate.  You notice increased swelling in your face, hands, legs, or ankles.  You are exposed to fifth disease or chickenpox.  You are exposed to German measles (rubella) and have never had it. Get help right away if:  You have a fever.  You are leaking fluid from your vagina.  You have spotting or bleeding from your vagina.  You have severe abdominal cramping or pain.  You have rapid weight gain or loss.  You vomit blood or material that looks like coffee grounds.  You develop a severe headache.  You have shortness of breath.  You have any kind of trauma, such as from a fall or a car accident. Summary  The first trimester of pregnancy is from week 1 until the end of week 13 (months 1 through 3).  Your body goes through many changes during pregnancy. The changes vary from  woman to woman.  You will have routine prenatal visits. During those visits, your health care provider will examine you, discuss any test results you may have, and talk with you about how you are feeling. This information is not intended to replace advice given to you by your health care provider. Make sure you discuss any questions you have with your health care provider. Document Revised: 02/13/2017 Document Reviewed: 02/13/2016 Elsevier Patient Education  2020 Elsevier Inc.  

## 2019-11-07 NOTE — Progress Notes (Signed)
NOB  Genetic Screening: Desires and wants to know gender. Pt given compassion care form and [facility] contact card.   Last Pap: per pt when she was 21.  CC: Sharp pain /abdominal and nausea vomiting sometimes. HA's no visual changes.  Fatigue and not sleeping well at night.  Pt only taking PNV's. Pt was told she would not be able to carry term from previous OB/GYN from [place] after doing a pap when she was 21 that she had a small cervix.   *B/P cuff not working will be sure get B/P before pt leaves.

## 2019-11-07 NOTE — Progress Notes (Signed)
Subjective:   Cindy Fletcher is a 24 y.o. G2P0010 at [redacted]w[redacted]d by LMP being seen today for her first obstetrical visit.  Her obstetrical history is significant for none, short cervix noted in a nonpregnant exam by previous Ob/Gyn and has Miscarriage and Supervision of normal pregnancy, antepartum on their problem list.. Patient does intend to breast feed. Pregnancy history fully reviewed.  Patient reports nausea and vomiting.  HISTORY: OB History  Gravida Para Term Preterm AB Living  2 0 0 0 1 0  SAB TAB Ectopic Multiple Live Births  1 0 0 0 0    # Outcome Date GA Lbr Len/2nd Weight Sex Delivery Anes PTL Lv  2 Current           1 SAB 07/20/19           History reviewed. No pertinent past medical history. History reviewed. No pertinent surgical history. History reviewed. No pertinent family history. Social History   Tobacco Use  . Smoking status: Never Smoker  . Smokeless tobacco: Never Used  Vaping Use  . Vaping Use: Never used  Substance Use Topics  . Alcohol use: Not Currently  . Drug use: Not Currently   No Known Allergies Current Outpatient Medications on File Prior to Visit  Medication Sig Dispense Refill  . Prenatal Vit-Fe Phos-FA-Omega (VITAFOL GUMMIES) 3.33-0.333-34.8 MG CHEW Chew 3 each by mouth daily. 90 tablet 12  . Blood Pressure Monitoring (BLOOD PRESSURE CUFF) MISC 1 Device by Does not apply route once a week. (Patient not taking: Reported on 11/07/2019) 1 each 0   No current facility-administered medications on file prior to visit.    Indications for ASA therapy (per uptodate) One of the following: Previous pregnancy with preeclampsia, especially early onset and with an adverse outcome No Multifetal gestation No Chronic hypertension No Type 1 or 2 diabetes mellitus No Chronic kidney disease No Autoimmune disease (antiphospholipid syndrome, systemic lupus erythematosus) No   Two or more of the following: Nulliparity Yes Obesity (body mass index >30  kg/m2) No Family history of preeclampsia in mother or sister No Age ?35 years No Sociodemographic characteristics (African American race, low socioeconomic level) No Personal risk factors (eg, previous pregnancy with low birth weight or small for gestational age infant, previous adverse pregnancy outcome [eg, stillbirth], interval >10 years between pregnancies) No   Indications for early 1 hour GTT (per uptodate)  BMI >25 (>23 in Asian women) AND one of the following  Gestational diabetes mellitus in a previous pregnancy No Glycated hemoglobin ?5.7 percent (39 mmol/mol), impaired glucose tolerance, or impaired fasting glucose on previous testing No First-degree relative with diabetes No High-risk race/ethnicity (eg, African American, Latino, Native American, [place] American, Pacific Islander) No History of cardiovascular disease No Hypertension or on therapy for hypertension No High-density lipoprotein cholesterol level <35 mg/dL (5.05 mmol/L) and/or a triglyceride level >250 mg/dL (3.97 mmol/L) No Polycystic ovary syndrome No Physical inactivity No Other clinical condition associated with insulin resistance (eg, severe obesity, acanthosis nigricans) No Previous birth of an infant weighing ?4000 g No Previous stillbirth of unknown cause No Exam   Vitals:   11/07/19 1318  Weight: 233 lb (105.7 kg)      Uterus:     Pelvic Exam: Perineum: no hemorrhoids, normal perineum   Vulva: normal external genitalia, no lesions   Vagina:  normal mucosa, normal discharge   Cervix: no lesions and normal, pap smear done.    Adnexa: normal adnexa and no mass, fullness, tenderness  Bony Pelvis: average  System: General: well-developed, well-nourished female in no acute distress   Breast:  normal appearance, no masses or tenderness   Skin: normal coloration and turgor, no rashes   Neurologic: oriented, normal, negative, normal mood   Extremities: normal strength, tone, and muscle mass, ROM of all  joints is normal   HEENT PERRLA, extraocular movement intact and sclera clear, anicteric   Mouth/Teeth mucous membranes moist, pharynx normal without lesions and dental hygiene good   Neck supple and no masses   Cardiovascular: regular rate and rhythm   Respiratory:  no respiratory distress, normal breath sounds   Abdomen: soft, non-tender; bowel sounds normal; no masses,  no organomegaly     Assessment:   Pregnancy: G2P0010 Patient Active Problem List   Diagnosis Date Noted  . Supervision of normal pregnancy, antepartum 11/07/2019  . Miscarriage 08/01/2019     Plan:  1. Supervision of other normal pregnancy, antepartum --Anticipatory guidance about next visits/weeks of pregnancy given. --Next visit in 4 weeks in office for AFP  - CBC/D/Plt+RPR+Rh+ABO+Rub Ab... - Culture, OB Urine - Genetic Screening - Cervicovaginal ancillary only( [place]) - Cytology - PAP - Enroll Patient in Babyscripts  2. [redacted] weeks gestation of pregnancy   3. Nausea and vomiting during pregnancy prior to [redacted] weeks gestation --Pt works swing shift, discussed taking medication at bedtime, whether that is day or night, but allowing 6-8 hours before working since it does cause drowsiness until she adjusts to it.  Pt will be on second shift next week, and will be able to take medication at 11 pm to sleep, which will be easier.  - Doxylamine-Pyridoxine (DICLEGIS) 10-10 MG TBEC; Take 2 tabs at bedtime. If needed, add another tab in the morning. If needed, add another tab in the afternoon, up to 4 tabs/day.  Dispense: 100 tablet; Refill: 5  5. Short cervix in non-gravid uterus --Pt was told by OB/Gyn in [place] that her cervix was short at a nonpregnant exam and that she would likely have preterm labor in the future.  Her cervix does appear short visually but otherwise normal on today's pelvic/Pap. --Will obtain baseline cervical length at 14 weeks or appropriate GA per MFM  - [place] MFM OB Transvaginal;  Future  Initial labs drawn. Continue prenatal vitamins. Discussed and offered genetic screening options, including Quad screen/AFP, NIPS testing, and option to decline testing. Benefits/risks/alternatives reviewed. Pt aware that anatomy US is form of genetic screening with lower accuracy in detecting trisomies than blood work.  Pt chooses genetic screening today. NIPS: ordered. Ultrasound discussed; fetal anatomic survey: requested. Problem list reviewed and updated. The nature of [facility] with multiple MDs and other Advanced Practice Providers was explained to patient; also emphasized that residents, students are part of our team. Routine obstetric precautions reviewed. No follow-ups on file.   Sharen Counter, CNM 11/07/19 1:47 PM

## 2019-11-08 DIAGNOSIS — O219 Vomiting of pregnancy, unspecified: Secondary | ICD-10-CM | POA: Insufficient documentation

## 2019-11-08 DIAGNOSIS — N883 Incompetence of cervix uteri: Secondary | ICD-10-CM | POA: Insufficient documentation

## 2019-11-08 LAB — CBC/D/PLT+RPR+RH+ABO+RUB AB...
Antibody Screen: NEGATIVE
Basophils Absolute: 0 10*3/uL (ref 0.0–0.2)
Basos: 0 %
EOS (ABSOLUTE): 0 10*3/uL (ref 0.0–0.4)
Eos: 0 %
HCV Ab: <0.1 s/co ratio (ref 0.0–0.9)
HIV Screen 4th Generation wRfx: NONREACTIVE
Hematocrit: 38.9 % (ref 34.0–46.6)
Hemoglobin: 12.5 g/dL (ref 11.1–15.9)
Hepatitis B Surface Ag: NEGATIVE
Immature Grans (Abs): 0 10*3/uL (ref 0.0–0.1)
Immature Granulocytes: 0 %
Lymphocytes Absolute: 2.1 10*3/uL (ref 0.7–3.1)
Lymphs: 21 %
MCH: 29.7 pg (ref 26.6–33.0)
MCHC: 32.1 g/dL (ref 31.5–35.7)
MCV: 92 fL (ref 79–97)
Monocytes Absolute: 0.6 10*3/uL (ref 0.1–0.9)
Monocytes: 6 %
Neutrophils Absolute: 7.2 10*3/uL — ABNORMAL HIGH (ref 1.4–7.0)
Neutrophils: 73 %
Platelets: 288 10*3/uL (ref 150–450)
RBC: 4.21 x10E6/uL (ref 3.77–5.28)
RDW: 13.7 % (ref 11.7–15.4)
RPR Ser Ql: NONREACTIVE
Rh Factor: POSITIVE
Rubella Antibodies, IGG: 9.89 index (ref >0.99)
WBC: 10 10*3/uL (ref 3.4–10.8)

## 2019-11-08 LAB — CERVICOVAGINAL ANCILLARY ONLY
Bacterial Vaginitis (gardnerella): NEGATIVE
Candida Glabrata: NEGATIVE
Candida Vaginitis: NEGATIVE
Chlamydia: NEGATIVE
Comment: NEGATIVE
Comment: NEGATIVE
Comment: NEGATIVE
Comment: NEGATIVE
Comment: NEGATIVE
Comment: NORMAL
Neisseria Gonorrhea: NEGATIVE
Trichomonas: NEGATIVE

## 2019-11-08 LAB — HCV INTERPRETATION

## 2019-11-08 MED ORDER — DOXYLAMINE-PYRIDOXINE 10-10 MG PO TBEC: DELAYED_RELEASE_TABLET | ORAL | 5 refills | Status: DC

## 2019-11-09 LAB — URINE CULTURE, OB REFLEX

## 2019-11-09 LAB — CULTURE, OB URINE

## 2019-11-11 ENCOUNTER — Encounter (HOSPITAL_COMMUNITY): Payer: Self-pay | Admitting: Obstetrics & Gynecology

## 2019-11-11 ENCOUNTER — Other Ambulatory Visit: Payer: Self-pay

## 2019-11-11 ENCOUNTER — Inpatient Hospital Stay (HOSPITAL_COMMUNITY)
Admission: AD | Admit: 2019-11-11 | Discharge: 2019-11-11 | Disposition: A | Discharge status: Home / Self Care | Payer: 59 | Attending: Obstetrics & Gynecology | Admitting: Obstetrics & Gynecology

## 2019-11-11 DIAGNOSIS — O10911 Unspecified pre-existing hypertension complicating pregnancy, first trimester: Secondary | ICD-10-CM | POA: Diagnosis not present

## 2019-11-11 DIAGNOSIS — O99511 Diseases of the respiratory system complicating pregnancy, first trimester: Secondary | ICD-10-CM | POA: Diagnosis not present

## 2019-11-11 DIAGNOSIS — O161 Unspecified maternal hypertension, first trimester: Secondary | ICD-10-CM | POA: Diagnosis not present

## 2019-11-11 DIAGNOSIS — O99891 Other specified diseases and conditions complicating pregnancy: Secondary | ICD-10-CM

## 2019-11-11 DIAGNOSIS — J45909 Unspecified asthma, uncomplicated: Secondary | ICD-10-CM | POA: Insufficient documentation

## 2019-11-11 DIAGNOSIS — Z3A11 11 weeks gestation of pregnancy: Secondary | ICD-10-CM

## 2019-11-11 DIAGNOSIS — M549 Dorsalgia, unspecified: Secondary | ICD-10-CM | POA: Insufficient documentation

## 2019-11-11 DIAGNOSIS — O26891 Other specified pregnancy related conditions, first trimester: Secondary | ICD-10-CM | POA: Diagnosis not present

## 2019-11-11 HISTORY — DX: Unspecified asthma, uncomplicated: J45.909

## 2019-11-11 LAB — CYTOLOGY - PAP
Comment: NEGATIVE
Diagnosis: UNDETERMINED — AB
High risk HPV: NEGATIVE

## 2019-11-11 LAB — URINALYSIS, ROUTINE W REFLEX MICROSCOPIC
Bilirubin Urine: NEGATIVE
Glucose, UA: NEGATIVE mg/dL
Hgb urine dipstick: NEGATIVE
Ketones, ur: NEGATIVE mg/dL
Leukocytes,Ua: NEGATIVE
Nitrite: NEGATIVE
Protein, ur: NEGATIVE mg/dL
Specific Gravity, Urine: 1.015 (ref 1.005–1.030)
pH: 7 (ref 5.0–8.0)

## 2019-11-11 MED ORDER — CYCLOBENZAPRINE HCL 10 MG PO TABS
10.0000 mg | ORAL_TABLET | Freq: Three times a day (TID) | ORAL | 0 refills | Status: DC | PRN
Start: 2019-11-11 — End: 2020-05-08

## 2019-11-11 NOTE — Discharge Instructions (Signed)
Acute Back Pain, Adult Acute back pain is sudden and usually short-lived. It is often caused by an injury to the muscles and tissues in the back. The injury may result from:  A muscle or ligament getting overstretched or torn (strained). Ligaments are tissues that connect bones to each other. Lifting something improperly can cause a back strain.  Wear and tear (degeneration) of the spinal disks. Spinal disks are circular tissue that provides cushioning between the bones of the spine (vertebrae).  Twisting motions, such as while playing sports or doing yard work.  A hit to the back.  Arthritis. You may have a physical exam, lab tests, and imaging tests to find the cause of your pain. Acute back pain usually goes away with rest and home care. Follow these instructions at home: Managing pain, stiffness, and swelling  Take over-the-counter and prescription medicines only as told by your health care provider.  Your health care provider may recommend applying ice during the first 24-48 hours after your pain starts. To do this: ? Put ice in a plastic bag. ? Place a towel between your skin and the bag. ? Leave the ice on for 20 minutes, 2-3 times a day.  If directed, apply heat to the affected area as often as told by your health care provider. Use the heat source that your health care provider recommends, such as a moist heat pack or a heating pad. ? Place a towel between your skin and the heat source. ? Leave the heat on for 20-30 minutes. ? Remove the heat if your skin turns bright red. This is especially important if you are unable to feel pain, heat, or cold. You have a greater risk of getting burned. Activity   Do not stay in bed. Staying in bed for more than 1-2 days can delay your recovery.  Sit up and stand up straight. Avoid leaning forward when you sit, or hunching over when you stand. ? If you work at a desk, sit close to it so you do not need to lean over. Keep your chin tucked  in. Keep your neck drawn back, and keep your elbows bent at a right angle. Your arms should look like the letter "L." ? Sit high and close to the steering wheel when you drive. Add lower back (lumbar) support to your car seat, if needed.  Take short walks on even surfaces as soon as you are able. Try to increase the length of time you walk each day.  Do not sit, drive, or stand in one place for more than 30 minutes at a time. Sitting or standing for long periods of time can put stress on your back.  Do not drive or use heavy machinery while taking prescription pain medicine.  Use proper lifting techniques. When you bend and lift, use positions that put less stress on your back: ? Bend your knees. ? Keep the load close to your body. ? Avoid twisting.  Exercise regularly as told by your health care provider. Exercising helps your back heal faster and helps prevent back injuries by keeping muscles strong and flexible.  Work with a physical therapist to make a safe exercise program, as recommended by your health care provider. Do any exercises as told by your physical therapist. Lifestyle  Maintain a healthy weight. Extra weight puts stress on your back and makes it difficult to have good posture.  Avoid activities or situations that make you feel anxious or stressed. Stress and anxiety increase muscle   tension and can make back pain worse. Learn ways to manage anxiety and stress, such as through exercise. General instructions  Sleep on a firm mattress in a comfortable position. Try lying on your side with your knees slightly bent. If you lie on your back, put a pillow under your knees.  Follow your treatment plan as told by your health care provider. This may include: ? Cognitive or behavioral therapy. ? Acupuncture or massage therapy. ? Meditation or yoga. Contact a health care provider if:  You have pain that is not relieved with rest or medicine.  You have increasing pain going down  into your legs or buttocks.  Your pain does not improve after 2 weeks.  You have pain at night.  You lose weight without trying.  You have a fever or chills. Get help right away if:  You develop new bowel or bladder control problems.  You have unusual weakness or numbness in your arms or legs.  You develop nausea or vomiting.  You develop abdominal pain.  You feel faint. Summary  Acute back pain is sudden and usually short-lived.  Use proper lifting techniques. When you bend and lift, use positions that put less stress on your back.  Take over-the-counter and prescription medicines and apply heat or ice as directed by your health care provider. This information is not intended to replace advice given to you by your health care provider. Make sure you discuss any questions you have with your health care provider. Document Revised: 06/22/2018 Document Reviewed: 10/15/2016 Elsevier Patient Education  2020 [employer].  

## 2019-11-11 NOTE — MAU Provider Note (Signed)
History     CSN: [id number]  Arrival date and time: 11/11/19 0109   First Provider Initiated Contact with Patient 11/11/19 1940      Chief Complaint  Patient presents with  . Back Pain   23 y.o. G2P0010 @11 .0 wks presenting with LBP. Sx started 2 days ago about 20 min after she lifted a case of water. No falls or injury. Pain is located in center of lumbar region. Pain is worse with movement. She tried a dose of Flexeril and Tylenol but had little relief. Denis urinary sx. No Vb or abd pain.   OB History    Gravida  2   Para      Term      Preterm      AB  1   Living        SAB  1   TAB      Ectopic      Multiple      Live Births              Past Medical History:  Diagnosis Date  . Asthma     Past Surgical History:  Procedure Laterality Date  . NO PAST SURGERIES      No family history on file.  Social History   Tobacco Use  . Smoking status: Never Smoker  . Smokeless tobacco: Never Used  Vaping Use  . Vaping Use: Never used  Substance Use Topics  . Alcohol use: Not Currently  . Drug use: Not Currently    Allergies: No Known Allergies  Medications Prior to Admission  Medication Sig Dispense Refill Last Dose  . cyclobenzaprine (FLEXERIL) 10 MG tablet Take 1 tablet (10 mg total) by mouth 3 (three) times daily as needed for muscle spasms. 30 tablet 0 11/11/2019 at Unknown time  . Prenatal Vit-Fe Phos-FA-Omega (VITAFOL GUMMIES) 3.33-0.333-34.8 MG CHEW Chew 3 each by mouth daily. 90 tablet 12 11/11/2019 at Unknown time  . Blood Pressure Monitoring (BLOOD PRESSURE CUFF) MISC 1 Device by Does not apply route once a week. (Patient not taking: Reported on 11/07/2019) 1 each 0   . Doxylamine-Pyridoxine (DICLEGIS) 10-10 MG TBEC Take 2 tabs at bedtime. If needed, add another tab in the morning. If needed, add another tab in the afternoon, up to 4 tabs/day. 100 tablet 5     Review of Systems  Constitutional: Negative for chills and fever.   Gastrointestinal: Negative for abdominal pain.  Genitourinary: Negative for difficulty urinating, dysuria, frequency, hematuria, urgency and vaginal bleeding.  Musculoskeletal: Positive for back pain.   Physical Exam   Blood pressure (!) 130/53, pulse 92, temperature 98.9 F (37.2 C), resp. rate 18, height 5' 8.5" (1.74 m), weight 106.6 kg, last menstrual period 08/26/2019, unknown if currently breastfeeding.  Physical Exam Vitals and nursing note reviewed.  Constitutional:      Appearance: Normal appearance.  HENT:     Head: Normocephalic and atraumatic.  Pulmonary:     Effort: Pulmonary effort is normal. No respiratory distress.  Abdominal:     Palpations: Abdomen is soft.     Tenderness: There is no abdominal tenderness.  Musculoskeletal:        General: Normal range of motion.     Cervical back: Normal and normal range of motion.     Thoracic back: Normal.     Lumbar back: Tenderness present. No swelling, deformity or bony tenderness.  Skin:    General: Skin is warm and dry.  Neurological:  General: No focal deficit present.     Mental Status: She is alert and oriented to person, place, and time.  Psychiatric:        Mood and Affect: Mood normal.    Unable to obtain FHT by doppler Limited bedside US: viable, active fetus, +cardiac activity, subj. nml AFV  Results for orders placed or performed during the hospital encounter of 11/11/19 (from the past 24 hour(s))  Urinalysis, Routine w reflex microscopic Urine, Clean Catch     Status: Abnormal   Collection Time: 11/11/19  7:25 PM  Result Value Ref Range   Color, Urine YELLOW YELLOW   APPearance HAZY (A) CLEAR   Specific Gravity, Urine 1.015 1.005 - 1.030   pH 7.0 5.0 - 8.0   Glucose, UA NEGATIVE NEGATIVE mg/dL   Hgb urine dipstick NEGATIVE NEGATIVE   Bilirubin Urine NEGATIVE NEGATIVE   Ketones, ur NEGATIVE NEGATIVE mg/dL   Protein, ur NEGATIVE NEGATIVE mg/dL   Nitrite NEGATIVE NEGATIVE   Leukocytes,Ua  NEGATIVE NEGATIVE   MAU Course  Procedures  MDM Labs ordered and reviewed. No signs of UTI, pyelo, or stones. Suspect MSK and discussed comfort measures and recommend heating pad, Tylenol, and Flexeril. Discussed with pt MSK strain may take several days to improve. HTN on intake but retake normal. Chart review shows BP also elevated at 8 wks, suspect CHTN, need to follow closely. Stable for discharge home.   Assessment and Plan   1. [redacted] weeks gestation of pregnancy   2. Musculoskeletal back pain   3. Hypertension during pregnancy in first trimester, unspecified hypertension in pregnancy type    Discharge home Follow up at [facility] as scheduled Return precautions  Allergies as of 11/11/2019   No Known Allergies     Medication List    TAKE these medications   Blood Pressure Cuff Misc 1 Device by Does not apply route once a week.   cyclobenzaprine 10 MG tablet Commonly known as: FLEXERIL Take 1 tablet (10 mg total) by mouth 3 (three) times daily as needed for muscle spasms.   Doxylamine-Pyridoxine 10-10 MG Tbec Commonly known as: Diclegis Take 2 tabs at bedtime. If needed, add another tab in the morning. If needed, add another tab in the afternoon, up to 4 tabs/day.   Vitafol Gummies 3.33-0.333-34.8 MG Chew Chew 3 each by mouth daily.      Donette Larry, CNM 11/11/2019, 8:19 PM

## 2019-11-11 NOTE — MAU Note (Signed)
Pt stated 3 days ago she started having back pain that has been causing her difficulty bending and walking. Taking tylenol without much relief.Pain got worse today can't put socks on or sit very comfortably. Tried flexeril that was prescribed over the phone and did not relieve it.  Denies any vag bleeding or discharge or pain with urination.

## 2019-11-14 ENCOUNTER — Encounter: Payer: Self-pay | Admitting: Advanced Practice Midwife

## 2019-11-14 DIAGNOSIS — R87610 Atypical squamous cells of undetermined significance on cytologic smear of cervix (ASC-US): Secondary | ICD-10-CM | POA: Insufficient documentation

## 2019-11-16 ENCOUNTER — Encounter: Payer: Self-pay | Admitting: Advanced Practice Midwife

## 2019-11-17 ENCOUNTER — Encounter: Payer: Self-pay | Admitting: Advanced Practice Midwife

## 2019-12-05 ENCOUNTER — Ambulatory Visit (INDEPENDENT_AMBULATORY_CARE_PROVIDER_SITE_OTHER): Payer: 59 | Admitting: Women's Health

## 2019-12-05 ENCOUNTER — Encounter: Payer: Self-pay | Admitting: Women's Health

## 2019-12-05 ENCOUNTER — Other Ambulatory Visit: Payer: Self-pay

## 2019-12-05 VITALS — BP 127/72 | HR 100 | Wt 240.0 lb

## 2019-12-05 DIAGNOSIS — Z3A14 14 weeks gestation of pregnancy: Secondary | ICD-10-CM

## 2019-12-05 DIAGNOSIS — O219 Vomiting of pregnancy, unspecified: Secondary | ICD-10-CM

## 2019-12-05 DIAGNOSIS — N883 Incompetence of cervix uteri: Secondary | ICD-10-CM

## 2019-12-05 DIAGNOSIS — Z3480 Encounter for supervision of other normal pregnancy, unspecified trimester: Secondary | ICD-10-CM | POA: Diagnosis not present

## 2019-12-05 DIAGNOSIS — R87610 Atypical squamous cells of undetermined significance on cytologic smear of cervix (ASC-US): Secondary | ICD-10-CM

## 2019-12-05 NOTE — Patient Instructions (Addendum)
Maternity Assessment Unit (MAU)  The Maternity Assessment Unit (MAU) is located at the [facility] and [facility]. The address is: [address], [place], [place], [place] [address]. Please see map below for additional directions.    The Maternity Assessment Unit is designed to help you during your pregnancy, and for up to 6 weeks after delivery, with any pregnancy- or postpartum-related emergencies, if you think you are in labor, or if your water has broken. For example, if you experience nausea and vomiting, vaginal bleeding, severe abdominal or pelvic pain, elevated blood pressure or other problems related to your pregnancy or postpartum time, please come to the Maternity Assessment Unit for assistance.        Pregnancy and Influenza  Influenza, also called the flu, is an infection of the lungs and airways (respiratory tract). If you are pregnant, you are more likely to catch the flu. You are also more likely to have a more serious case of the flu. This is because pregnancy causes changes to your body's disease-fighting system (immune system), heart, and lungs. If you develop a bad case of the flu, especially with a high fever, this can cause problems for you and your developing baby. How do people get the flu? The flu is caused by a type of germ called a virus. It spreads when virus particles get passed from person to person by:  Being near a sick person who is coughing or sneezing.  Touching something that has the virus on it and then touching your mouth, nose, or face. The influenza virus is most common during the fall and winter. How can I protect myself against the flu?  Get a flu shot. The best way to prevent the flu is to get a flu shot before flu season starts. The flu shot is not dangerous for your developing baby. It may even help protect your baby from the flu for up to 6 months after birth.  Wash your hands often with soap and warm water.  If soap and water are not available, use hand sanitizer.  Do not come in close contact with sick people.  Do not share food, drinks, or utensils with other people.  Avoid touching your eyes, nose, and mouth.  Clean frequently used surfaces at home, school, or work.  Practice healthy lifestyle habits, such as: ? Eating a healthy, balanced diet. ? Drinking plenty of fluids. ? Exercising regularly or as told by your health care provider. ? Sleeping 7-9 hours each night. ? Finding ways to manage stress. What should I do if I have flu symptoms?  If you have any symptoms of the flu, even after getting a flu shot, contact your health care provider right away.  To reduce fever, take over-the-counter acetaminophen as told by your health care provider.  If you have the flu, you may get antiviral medicine to keep the flu from becoming severe and to shorten how long it lasts.  Avoid spreading the flu to others: ? Stay home until you are well. ? Cover your nose and mouth when you cough or sneeze. ? Wash your hands often. Follow these instructions at home:  Take over-the-counter and prescription medicines only as told by your health care provider. Do not take any medicine, including cold or flu medicine, unless your health care provider tells you to do so.  If you were prescribed antiviral medicine, take it as told by your health care provider. Do not stop taking the antiviral  medicine even if you start to feel better.  Eat a nutrient-rich diet that includes fresh fruits and vegetables, whole grains, lean protein, and low-fat dairy.  Drink enough fluid to keep your urine clear or pale yellow.  Get plenty of rest. Contact a health care provider if:  You have fever or chills.  You have a cough, sore throat, or stuffy nose.  You have worsening or unusual: ? Muscle aches. ? Headache. ? Tiredness. ? Loss of appetite.  You have vomiting or diarrhea. Get help right away if:  You have  trouble breathing.  You have chest pain.  You have abdominal pain.  You begin to have labor pains.  You have a fever that does not go down 24 hours after you take medicine.  You do not feel your baby move.  You have diarrhea or vomiting that will not go away.  You have dizziness or confusion.  Your symptoms do not improve, even with treatment. Summary  If you are pregnant, you are more likely to catch the flu. You are also more likely to have a more serious case of the flu.  If you have flu-like symptoms, call your health care provider right away. If you develop a bad case of the flu, especially with a high fever, this can be dangerous for your developing baby.  The best way to prevent the flu is to get a flu shot before flu season starts. The flu shot is not dangerous for your developing baby.  If you have the flu and were prescribed antiviral medicine, take it as told by your health care provider. This information is not intended to replace advice given to you by your health care provider. Make sure you discuss any questions you have with your health care provider. Document Revised: 06/25/2018 Document Reviewed: 04/29/2016 Elsevier Patient Education  2020 Elsevier Inc.        Pregnancy and COVID-19 Coronavirus disease, also called COVID-19, is an infection of the lungs and airways (respiratory tract). It is unclear at this time if pregnancy makes it more likely for you to get COVID-19, or what effects the infection may have on your unborn baby. However, pregnancy causes changes to your heart, lungs, and your body's disease-fighting system (immune system). Some of these changes make it more likely for you to get sick and have more serious illness. Therefore, it is important for you to take precautions in order to protect yourself and your unborn baby. There have been studies showing that obesity and diabetes may put you at higher risk for serious illness. If you are pregnant  and are obese or have diabetes, you should take extra precautions to protect yourself from the virus. Work with your health care team to develop a plan to protect yourself from all infections, including COVID-19. This is one way for you to stay healthy during your pregnancy and to keep your baby healthy as well. How does this affect me? If you get COVID-19, there is a risk that you may:  Get a respiratory illness that can lead to pneumonia.  Give birth to your baby before 37 weeks of pregnancy (premature birth). If you have or may have COVID-19, your health care provider may recommend special precautions around your pregnancy. This may affect how you:  Receive care before delivery (prenatal care). How you visit your health care provider may change. Tests and scans may need to be performed differently.  Receive care during labor and delivery. This may affect your birth  plan, including who may be with you during labor and delivery.  Receive care after you deliver your baby (postpartum care). You may stay longer in the hospital and in a special room.  Feed your baby after he or she is born. Pregnancy can be an especially stressful time because of the changes in your body and the preparation involved in becoming a parent. In addition, you may be feeling especially fearful, anxious, or stressed because of COVID-19 and how it is affecting you. How does this affect my baby? It is not known whether a mother will transmit the virus to her unborn baby. There is a risk that if you get COVID-19:  The virus that causes COVID-19 can pass to your baby.  You may have premature birth. Your baby may require more medical care if this happens. What can I do to lower my risk?  There is no vaccine to help prevent COVID-19. However, there are actions that you can take to protect yourself and others from this virus. Cleaning and personal hygiene  Wash your hands often with soap and water for at least 20  seconds. If soap and water are not available, use alcohol-based hand sanitizer.  Avoid touching your mouth, face, eyes, or nose.  Clean and disinfect objects and surfaces that are frequently touched every day. These may include: ? Counters and tables. ? Doorknobs and light switches. ? Sinks and faucets. ? Electronics such as phones, remote controls, keyboards, computers, and tablets. Stay away from others  Stay away from people who are sick, if possible.  Avoid social gatherings and travel.  Stay home as much as possible. Follow these instructions: Breastfeeding It is not known if the virus that causes COVID-19 can pass through breast milk to your baby. You should make a plan for feeding your infant with your family and your health care team. If you have or may have COVID-19, your health care provider may recommend that you take precautions while breastfeeding, such as:  Washing your hands before feeding your baby.  Wearing a mask while feeding your baby.  Pumping or expressing breast milk to feed to your baby. If possible, ask someone in your household who is not sick to feed your baby the expressed breast milk. ? Wash your hands before touching pump parts. ? Wash and disinfect all pump parts after expressing milk. Follow the manufacturer's instructions to clean and disinfect all pump parts. General instructions  If you think you have a COVID-19 infection, contact your health care provider right away. Tell your health care provider that you think you may have a COVID-19 infection.  Follow your health care provider's instructions on taking medicines. Some medicines may be unsafe to take during pregnancy.  Cover your mouth and nose by wearing a mask or other cloth covering over your face when you go out in public.  Find ways to manage stress. These may include: ? Using relaxation techniques like meditation and deep breathing. ? Getting regular exercise. Most women can continue  their usual exercise routine during pregnancy. Ask your health care provider what activities are safe for you. ? Seeking support from family, friends, or spiritual resources. If you cannot be together in person, you can still connect by phone calls, texts, video calls, or online messaging. ? Spending time doing relaxing activities that you enjoy, like listening to music or reading a good book.  Ask for help if you have counseling or nutritional needs during pregnancy. Your health care provider can offer  advice or refer you to resources or specialists who can help you with various needs.  Keep all follow-up visits as told by your health care provider. This is important. Where to find more information Centers for Disease Control and Prevention (CDC): AffordableShare.com.br World Health Organization [facility]): PokerPortraits.es [employer]necologists (ACOG): BuyDucts.dk Questions to ask your health care team  What should I do if I have COVID-19 symptoms?  How will COVID-19 affect my prenatal care visits, tests and scans, labor and delivery, and postpartum care?  Should I plan to breastfeed my baby?  Where can I find mental health resources?  Where can I find support if I have financial concerns? Contact a health care provider if:  You have signs and symptoms of infection, including a fever or cough. Tell your health care team that you think you may have a COVID-19 infection.  You have strong emotions, such as sadness or anxiety.  You feel unsafe in your home and need help finding a safe place to live.  You have bloody or watery vaginal discharge or vaginal bleeding. Get help right away if:  You have signs or symptoms of labor before 37 weeks of pregnancy. These include: ? Contractions that are 5 minutes or  less apart, or that increase in frequency, intensity, or length. ? Sudden, sharp pain in the abdomen or in the lower back. ? A gush or trickle of fluid from your vagina.  You have signs of more serious illness such as: ? You have difficulty breathing. ? You have chest pain. ? You have a fever greater than 102F (39C) or higher that does not go away. ? You cannot drink fluids without vomiting. ? You feel extremely weak or you faint. These symptoms may represent a serious problem that is an emergency. Do not wait to see if the symptoms will go away. Get medical help right away. Call your local emergency services (911 in the [place] Do not drive yourself to the hospital. Summary  Coronavirus disease, also called COVID-19, is an infection of the lungs and airways (respiratory tract). It is unclear at this time if pregnancy makes you more susceptible to COVID-19 and what effects it may have on unborn babies.  It is important to take precautions to protect yourself and your developing baby. This includes washing your hands often, avoiding touching your mouth, face, eyes, or nose, avoiding social gatherings and travel, and staying away from people who are sick.  If you think you have a COVID-19 infection, contact your health care provider right away. Tell your health care provider that you think you may have a COVID-19 infection.  If you have or may have COVID-19, your health care provider may recommend special precautions during your pregnancy, labor and delivery, and after your baby is born. This information is not intended to replace advice given to you by your health care provider. Make sure you discuss any questions you have with your health care provider. Document Revised: 12/24/2018 Document Reviewed: 06/29/2018 Elsevier Patient Education  2020 [employer].        Alpha-Fetoprotein Test Why am I having this test? The alpha-fetoprotein test is most commonly used in pregnant women to  help screen for birth defects in their unborn baby. It can be used to screen for birth defects, such as chromosome (DNA) abnormalities, problems with the brain or spinal cord, or problems with the abdominal wall of the unborn baby (fetus). The alpha-fetoprotein test may also be done for men  or non-pregnant women to check for certain cancers. What is being tested? This test measures the amount of alpha-fetoprotein (AFP) in your blood. AFP is a protein that is made by the liver. Levels can be detected in the mother's blood during pregnancy, starting at 10 weeks and peaking at 16-18 weeks of the pregnancy. Abnormal levels can sometimes be a sign of a birth defect in the baby. Certain cancers can cause a high level of AFP in men and non-pregnant women. What kind of sample is taken?  A blood sample is required for this test. It is usually collected by inserting a needle into a blood vessel. How are the results reported? Your test results will be reported as values. Your health care provider will compare your results to normal ranges that were established after testing a large group of people (reference values). Reference values may vary among labs and hospitals. For this test, common reference values are:  Adult: Less than 40 ng/mL or less than 40 mcg/L (SI units).  Child younger than 1 year: Less than 30 ng/mL. If you are pregnant, the values may also vary based on how long you have been pregnant. What do the results mean? Results that are above the reference values in pregnant women may indicate the following for the baby:  Neural tube defects, such as abnormalities of the spinal cord or brain.  Abdominal wall defects.  Multiple pregnancy such as twins.  Fetal distress or fetal death. Results that are above the reference values in men or non-pregnant women may indicate:  Reproductive cancers, such as ovarian or testicular cancer.  Liver cancer.  Liver cell death.  Other types of  cancer. Very low levels of AFP in pregnant women may indicate the following for the baby:  Down syndrome.  Fetal death. Talk with your health care provider about what your results mean. Questions to ask your health care provider Ask your health care provider, or the department that is doing the test:  When will my results be ready?  How will I get my results?  What are my treatment options?  What other tests do I need?  What are my next steps? Summary  The alpha-fetoprotein test is done on pregnant women to help screen for birth defects in their unborn baby.  Certain cancers can cause a high level of AFP in men and non-pregnant women.  For this test, a blood sample is usually collected by inserting a needle into a blood vessel.  Talk with your health care provider about what your results mean. This information is not intended to replace advice given to you by your health care provider. Make sure you discuss any questions you have with your health care provider. Document Revised: 02/13/2017 Document Reviewed: 10/07/2016 Elsevier Patient Education  2020 [employer].        Preterm Labor and Birth Information  The normal length of a pregnancy is 39-41 weeks. Preterm labor is when labor starts before 37 completed weeks of pregnancy. What are the risk factors for preterm labor? Preterm labor is more likely to occur in women who:  Have certain infections during pregnancy such as a bladder infection, sexually transmitted infection, or infection inside the uterus (chorioamnionitis).  Have a shorter-than-normal cervix.  Have gone into preterm labor before.  Have had surgery on their cervix.  Are younger than age 62 or older than age 87.  Are African American.  Are pregnant with twins or multiple babies (multiple gestation).  Take street drugs  or smoke while pregnant.  Do not gain enough weight while pregnant.  Became pregnant shortly after having been  pregnant. What are the symptoms of preterm labor? Symptoms of preterm labor include:  Cramps similar to those that can happen during a menstrual period. The cramps may happen with diarrhea.  Pain in the abdomen or lower back.  Regular uterine contractions that may feel like tightening of the abdomen.  A feeling of increased pressure in the pelvis.  Increased watery or bloody mucus discharge from the vagina.  Water breaking (ruptured amniotic sac). Why is it important to recognize signs of preterm labor? It is important to recognize signs of preterm labor because babies who are born prematurely may not be fully developed. This can put them at an increased risk for:  Long-term (chronic) heart and lung problems.  Difficulty immediately after birth with regulating body systems, including blood sugar, body temperature, heart rate, and breathing rate.  Bleeding in the brain.  Cerebral palsy.  Learning difficulties.  Death. These risks are highest for babies who are born before 34 weeks of pregnancy. How is preterm labor treated? Treatment depends on the length of your pregnancy, your condition, and the health of your baby. It may involve:  Having a stitch (suture) placed in your cervix to prevent your cervix from opening too early (cerclage).  Taking or being given medicines, such as: ? Hormone medicines. These may be given early in pregnancy to help support the pregnancy. ? Medicine to stop contractions. ? Medicines to help mature the baby's lungs. These may be prescribed if the risk of delivery is high. ? Medicines to prevent your baby from developing cerebral palsy. If the labor happens before 34 weeks of pregnancy, you may need to stay in the hospital. What should I do if I think I am in preterm labor? If you think that you are going into preterm labor, call your health care provider right away. How can I prevent preterm labor in future pregnancies? To increase your chance  of having a full-term pregnancy:  Do not use any tobacco products, such as cigarettes, chewing tobacco, and e-cigarettes. If you need help quitting, ask your health care provider.  Do not use street drugs or medicines that have not been prescribed to you during your pregnancy.  Talk with your health care provider before taking any herbal supplements, even if you have been taking them regularly.  Make sure you gain a healthy amount of weight during your pregnancy.  Watch for infection. If you think that you might have an infection, get it checked right away.  Make sure to tell your health care provider if you have gone into preterm labor before. This information is not intended to replace advice given to you by your health care provider. Make sure you discuss any questions you have with your health care provider. Document Revised: 06/25/2018 Document Reviewed: 07/25/2015 Elsevier Patient Education  2020 [employer].

## 2019-12-05 NOTE — Progress Notes (Signed)
Subjective:  Cindy Fletcher is a 24 y.o. G2P0010 at [redacted]w[redacted]d being seen today for ongoing prenatal care.  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  Patient reports no complaints.  Contractions: Not present. Vag. Bleeding: None.   . Denies leaking of fluid.   The following portions of the patient's history were reviewed and updated as appropriate: allergies, current medications, past family history, past medical history, past social history, past surgical history and problem list. Problem list updated.  Objective:   Vitals:   12/05/19 0854  BP: 127/72  Pulse: 100  Weight: 240 lb (108.9 kg)    Fetal Status: Fetal Heart Rate (bpm): 148         General:  Alert, oriented and cooperative. Patient is in no acute distress.  Skin: Skin is warm and dry. No rash noted.   Cardiovascular: Normal heart rate noted  Respiratory: Normal respiratory effort, no problems with respiration noted  Abdomen: Soft, gravid, appropriate for gestational age. Pain/Pressure: Present     Pelvic: Vag. Bleeding: None     Cervical exam deferred        Extremities: Normal range of motion.  Edema: Trace  Mental Status: Normal mood and affect. Normal behavior. Normal judgment and thought content.   Urinalysis:      Assessment and Plan:  Pregnancy: G2P0010 at [redacted]w[redacted]d  1. Supervision of other normal pregnancy, antepartum - discussed flu vaccine, pt declines - discussed COVID vaccine, pt declines -COVID-19 Vaccine Counseling: The patient was counseled on the potential benefits and lack of known risks of COVID vaccination, during pregnancy and breastfeeding, during today's visit. The patient's questions and concerns were addressed today, including  safety of the vaccination and potential side effects as they have been published by ACOG and SMFM. The patient has been informed that there have not been any documented vaccine related injuries, deaths or birth defects to infant or mom after receiving the COVID-19 vaccine to date. The patient has been made aware that although she is not at increased risk of contracting COVID-19 during pregnancy, she is at increased risk of developing severe disease and complications if she contracts COVID-19 while pregnant. All patient questions were addressed during our visit today. The patient is not planning to get vaccinated at this time.  - discussed contraception, pt unsure, information given - next visit in 4 weeks for AFP  2. Nausea and vomiting during pregnancy prior to [redacted] weeks gestation - no concerns  3. 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 ASCUS of cervix with negative high risk HPV - repeat Pap 3 years  Preterm labor symptoms and general obstetric precautions including but not limited to vaginal bleeding, contractions, leaking of fluid and fetal movement were reviewed in detail with the patient. I discussed the assessment and treatment plan with the patient. The patient was provided an opportunity to ask questions and all were answered. The patient agreed with the plan and demonstrated an understanding of the instructions. The patient was advised to call back or seek an in-person office evaluation/go to MAU  at [facility] for any urgent or concerning symptoms. Please refer to After Visit Summary for other counseling recommendations.  Return for in-person LOB/APP OK/AFP, needs [place] scheduled for cervical length ASAP.   Evon Lopezperez, Odie Sera, NP

## 2019-12-07 ENCOUNTER — Telehealth: Payer: Self-pay | Admitting: *Deleted

## 2019-12-07 NOTE — Telephone Encounter (Signed)
Placed call to pt regarding paperwork we received in office. Pt states her employer told her that there were no accommodations that could be made for her, she would have to go out of work and apply for disability.  Advised to discuss again with her HR dept and if they can supply office with accommodation forms, they could be completed to reevaluate her position.  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  Pt made aware that forms cannot be completed until she discusses this with a provider.  Typically, not approved for leave if employer initiated.  Pt made aware that she would have to have a medical need in order to not be able to work.   Pt advised to make office aware if additional forms needed and/or if she needs an appt to discuss with provider.   Pt states understanding.

## 2019-12-09 ENCOUNTER — Other Ambulatory Visit: Payer: BC Managed Care – PPO

## 2019-12-23 ENCOUNTER — Ambulatory Visit: Payer: 59 | Admitting: *Deleted

## 2019-12-23 ENCOUNTER — Other Ambulatory Visit: Payer: Self-pay

## 2019-12-23 ENCOUNTER — Ambulatory Visit: Payer: 59 | Attending: Advanced Practice Midwife

## 2019-12-23 ENCOUNTER — Other Ambulatory Visit: Payer: Self-pay | Admitting: *Deleted

## 2019-12-23 DIAGNOSIS — O10919 Unspecified pre-existing hypertension complicating pregnancy, unspecified trimester: Secondary | ICD-10-CM

## 2019-12-23 DIAGNOSIS — Z3480 Encounter for supervision of other normal pregnancy, unspecified trimester: Secondary | ICD-10-CM | POA: Diagnosis not present

## 2019-12-23 DIAGNOSIS — N883 Incompetence of cervix uteri: Secondary | ICD-10-CM | POA: Diagnosis not present

## 2019-12-23 DIAGNOSIS — O99212 Obesity complicating pregnancy, second trimester: Secondary | ICD-10-CM | POA: Diagnosis not present

## 2019-12-23 DIAGNOSIS — O26872 Cervical shortening, second trimester: Secondary | ICD-10-CM | POA: Diagnosis not present

## 2019-12-23 DIAGNOSIS — Z3A17 17 weeks gestation of pregnancy: Secondary | ICD-10-CM

## 2019-12-23 DIAGNOSIS — Z8759 Personal history of other complications of pregnancy, childbirth and the puerperium: Secondary | ICD-10-CM | POA: Diagnosis not present

## 2019-12-28 ENCOUNTER — Telehealth: Payer: Self-pay

## 2019-12-28 NOTE — Telephone Encounter (Signed)
Hi Cindy Fletcher, We have made several attempts to reach you via telephone x 4. Please call our Office on [phone].  We need information from you to complete your Forms.  Thanks.

## 2020-01-02 ENCOUNTER — Ambulatory Visit (INDEPENDENT_AMBULATORY_CARE_PROVIDER_SITE_OTHER): Payer: 59 | Admitting: Advanced Practice Midwife

## 2020-01-02 ENCOUNTER — Other Ambulatory Visit: Payer: Self-pay

## 2020-01-02 VITALS — BP 124/82 | HR 98 | Wt 244.2 lb

## 2020-01-02 DIAGNOSIS — M549 Dorsalgia, unspecified: Secondary | ICD-10-CM

## 2020-01-02 DIAGNOSIS — Z3A18 18 weeks gestation of pregnancy: Secondary | ICD-10-CM

## 2020-01-02 DIAGNOSIS — R200 Anesthesia of skin: Secondary | ICD-10-CM

## 2020-01-02 DIAGNOSIS — R202 Paresthesia of skin: Secondary | ICD-10-CM

## 2020-01-02 DIAGNOSIS — O99891 Other specified diseases and conditions complicating pregnancy: Secondary | ICD-10-CM

## 2020-01-02 DIAGNOSIS — Z3480 Encounter for supervision of other normal pregnancy, unspecified trimester: Secondary | ICD-10-CM

## 2020-01-02 MED ORDER — COMFORT FIT MATERNITY SUPP MED MISC: 1.0000 | Freq: Every day | 0 refills | Status: DC

## 2020-01-02 NOTE — Progress Notes (Signed)
   PRENATAL VISIT NOTE  Subjective:  Cindy Fletcher is a 24 y.o. G2P0010 at [redacted]w[redacted]d being seen today for ongoing prenatal care.  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  Patient reports occasional vomiting, numbness in bilateral upper legs, intermittent.  Contractions: Not present. Vag. Bleeding: None.  Movement: Present. Denies leaking of fluid.   The following portions of the patient's history were reviewed and updated as appropriate: allergies, current medications, past family history, past medical history, past social history, past surgical history and problem list.   Objective:   Vitals:   01/02/20 1605  BP: 124/82  Pulse: 98  Weight: 244 lb 3.2 oz (110.8 kg)    Fetal Status: Fetal Heart Rate (bpm): 150   Movement: Present     General:  Alert, oriented and cooperative. Patient is in no acute distress.  Skin: Skin is warm and dry. No rash noted.   Cardiovascular: Normal heart rate noted  Respiratory: Normal respiratory effort, no problems with respiration noted  Abdomen: Soft, gravid, appropriate for gestational age.  Pain/Pressure: Present     Pelvic: Cervical exam deferred        Extremities: Normal range of motion.  Edema: Trace  Mental Status: Normal mood and affect. Normal behavior. Normal judgment and thought content.   Assessment and Plan:  Pregnancy: G2P0010 at [redacted]w[redacted]d 1. Supervision of other normal pregnancy, antepartum --Anticipatory guidance about next visits/weeks of pregnancy given. --Next visit in 4 weeks virtual  - AFP, Serum, Open Spina Bifida  2. [redacted] weeks gestation of pregnancy   3. Numbness and tingling of both legs --Likely nerve related, sciatica.   --Rx for support belt,  discussed physical therapy if not improved  4. Back pain affecting pregnancy in second trimester --Rest/ice/heat/warm bath/Tylenol/pregnancy support belt  - Elastic Bandages & Supports (COMFORT FIT MATERNITY SUPP MED) MISC; 1 Device by Does not apply route daily.  Dispense: 1 each; Refill: 0  Preterm labor symptoms and general obstetric precautions including but not limited to vaginal bleeding, contractions, leaking of fluid and fetal movement were reviewed in detail with the patient. Please refer to After Visit Summary for other counseling recommendations.   No follow-ups on file.  Future Appointments  Date Time Provider Department Center  01/13/2020  8:15 AM [facility] NURSE [facility]  01/13/2020  8:30 AM [facility] US2 [facility]    Sharen Counter, CNM

## 2020-01-02 NOTE — Patient Instructions (Signed)

## 2020-01-04 LAB — AFP, SERUM, OPEN SPINA BIFIDA
AFP MoM: 0.87
AFP Value: 30.2 ng/mL
Gest. Age on Collection Date: 18.4 weeks
Maternal Age At EDD: 24.3 yr
OSBR Risk 1 IN: 10000
Test Results:: NEGATIVE
Weight: 244 [lb_av]

## 2020-01-06 DIAGNOSIS — O339 Maternal care for disproportion, unspecified: Secondary | ICD-10-CM | POA: Diagnosis not present

## 2020-01-13 ENCOUNTER — Encounter: Payer: Self-pay | Admitting: *Deleted

## 2020-01-13 ENCOUNTER — Ambulatory Visit: Payer: 59 | Admitting: *Deleted

## 2020-01-13 ENCOUNTER — Other Ambulatory Visit: Payer: Self-pay | Admitting: *Deleted

## 2020-01-13 ENCOUNTER — Ambulatory Visit: Payer: 59 | Attending: Obstetrics and Gynecology

## 2020-01-13 ENCOUNTER — Other Ambulatory Visit: Payer: Self-pay

## 2020-01-13 VITALS — BP 135/66 | HR 89

## 2020-01-13 DIAGNOSIS — E669 Obesity, unspecified: Secondary | ICD-10-CM | POA: Diagnosis not present

## 2020-01-13 DIAGNOSIS — O10919 Unspecified pre-existing hypertension complicating pregnancy, unspecified trimester: Secondary | ICD-10-CM | POA: Insufficient documentation

## 2020-01-13 DIAGNOSIS — O99212 Obesity complicating pregnancy, second trimester: Secondary | ICD-10-CM

## 2020-01-13 DIAGNOSIS — Z363 Encounter for antenatal screening for malformations: Secondary | ICD-10-CM

## 2020-01-13 DIAGNOSIS — Z3A20 20 weeks gestation of pregnancy: Secondary | ICD-10-CM | POA: Diagnosis not present

## 2020-01-13 DIAGNOSIS — N883 Incompetence of cervix uteri: Secondary | ICD-10-CM

## 2020-01-13 DIAGNOSIS — O10912 Unspecified pre-existing hypertension complicating pregnancy, second trimester: Secondary | ICD-10-CM

## 2020-01-13 DIAGNOSIS — Z362 Encounter for other antenatal screening follow-up: Secondary | ICD-10-CM

## 2020-01-30 ENCOUNTER — Telehealth (INDEPENDENT_AMBULATORY_CARE_PROVIDER_SITE_OTHER): Payer: 59 | Admitting: Advanced Practice Midwife

## 2020-01-30 DIAGNOSIS — Z3A22 22 weeks gestation of pregnancy: Secondary | ICD-10-CM

## 2020-01-30 DIAGNOSIS — Z3480 Encounter for supervision of other normal pregnancy, unspecified trimester: Secondary | ICD-10-CM

## 2020-01-30 NOTE — Patient Instructions (Signed)

## 2020-01-30 NOTE — Progress Notes (Signed)
No show for virtual appointment, call to reschedule pt

## 2020-02-13 ENCOUNTER — Ambulatory Visit: Payer: 59 | Admitting: *Deleted

## 2020-02-13 ENCOUNTER — Ambulatory Visit: Payer: 59 | Attending: Obstetrics and Gynecology

## 2020-02-13 ENCOUNTER — Encounter: Payer: Self-pay | Admitting: *Deleted

## 2020-02-13 ENCOUNTER — Other Ambulatory Visit: Payer: Self-pay

## 2020-02-13 VITALS — BP 128/65 | HR 84

## 2020-02-13 DIAGNOSIS — O132 Gestational [pregnancy-induced] hypertension without significant proteinuria, second trimester: Secondary | ICD-10-CM

## 2020-02-13 DIAGNOSIS — Z6841 Body Mass Index (BMI) 40.0 and over, adult: Secondary | ICD-10-CM

## 2020-02-13 DIAGNOSIS — O99212 Obesity complicating pregnancy, second trimester: Secondary | ICD-10-CM | POA: Diagnosis not present

## 2020-02-13 DIAGNOSIS — E669 Obesity, unspecified: Secondary | ICD-10-CM

## 2020-02-13 DIAGNOSIS — Z362 Encounter for other antenatal screening follow-up: Secondary | ICD-10-CM | POA: Diagnosis not present

## 2020-02-13 DIAGNOSIS — Z3A24 24 weeks gestation of pregnancy: Secondary | ICD-10-CM

## 2020-03-13 ENCOUNTER — Inpatient Hospital Stay (HOSPITAL_COMMUNITY)
Admission: AD | Admit: 2020-03-13 | Discharge: 2020-03-14 | Disposition: A | Discharge status: Home / Self Care | Payer: 59 | Attending: Obstetrics & Gynecology | Admitting: Obstetrics & Gynecology

## 2020-03-13 ENCOUNTER — Other Ambulatory Visit: Payer: Self-pay

## 2020-03-13 DIAGNOSIS — U071 COVID-19: Secondary | ICD-10-CM | POA: Diagnosis not present

## 2020-03-13 DIAGNOSIS — Z3A28 28 weeks gestation of pregnancy: Secondary | ICD-10-CM

## 2020-03-13 DIAGNOSIS — R519 Headache, unspecified: Secondary | ICD-10-CM | POA: Diagnosis not present

## 2020-03-13 DIAGNOSIS — Z3689 Encounter for other specified antenatal screening: Secondary | ICD-10-CM

## 2020-03-13 DIAGNOSIS — O98513 Other viral diseases complicating pregnancy, third trimester: Secondary | ICD-10-CM | POA: Insufficient documentation

## 2020-03-13 LAB — URINALYSIS, ROUTINE W REFLEX MICROSCOPIC
Bilirubin Urine: NEGATIVE
Glucose, UA: NEGATIVE mg/dL
Hgb urine dipstick: NEGATIVE
Ketones, ur: NEGATIVE mg/dL
Leukocytes,Ua: NEGATIVE
Nitrite: NEGATIVE
Protein, ur: NEGATIVE mg/dL
Specific Gravity, Urine: 1.008 (ref 1.005–1.030)
pH: 6 (ref 5.0–8.0)

## 2020-03-13 MED ORDER — DIPHENHYDRAMINE HCL 50 MG/ML IJ SOLN: 25.0000 mg | Freq: Once | INTRAMUSCULAR | Status: DC

## 2020-03-13 MED ORDER — DEXAMETHASONE SODIUM PHOSPHATE 10 MG/ML IJ SOLN: 10.0000 mg | Freq: Once | INTRAMUSCULAR | Status: DC

## 2020-03-13 MED ORDER — LACTATED RINGERS IV BOLUS: 1000.0000 mL | Freq: Once | INTRAVENOUS | Status: DC

## 2020-03-13 MED ORDER — METOCLOPRAMIDE HCL 5 MG/ML IJ SOLN: 10.0000 mg | Freq: Once | INTRAMUSCULAR | Status: DC

## 2020-03-13 NOTE — MAU Note (Signed)
PT SAYS YESTERDAY- HAD MILD H/A - REG 2 TABS TYLENOL- RELIEF. TODAY - H/A -TOOK TYLENOL AT 1, 830PM-  NOW MILD H/A 4/10. HAS COUGH AND CONGESTION- STARTED TODAY - NO CONTACT WITH COVID .

## 2020-03-13 NOTE — MAU Note (Signed)
[facility] WITH [facility]

## 2020-03-14 ENCOUNTER — Encounter (HOSPITAL_COMMUNITY): Payer: Self-pay | Admitting: Obstetrics & Gynecology

## 2020-03-14 DIAGNOSIS — Z3689 Encounter for other specified antenatal screening: Secondary | ICD-10-CM

## 2020-03-14 DIAGNOSIS — Z3A28 28 weeks gestation of pregnancy: Secondary | ICD-10-CM

## 2020-03-14 DIAGNOSIS — O98513 Other viral diseases complicating pregnancy, third trimester: Secondary | ICD-10-CM | POA: Diagnosis not present

## 2020-03-14 DIAGNOSIS — U071 COVID-19: Secondary | ICD-10-CM | POA: Diagnosis present

## 2020-03-14 LAB — RESP PANEL BY RT-PCR (FLU A&B, COVID) ARPGX2
Influenza A by PCR: NEGATIVE
Influenza B by PCR: NEGATIVE
SARS Coronavirus 2 by RT PCR: POSITIVE — AB

## 2020-03-14 NOTE — Discharge Instructions (Signed)
10 Things You Can Do to Manage Your COVID-19 Symptoms at Home If you have possible or confirmed COVID-19: 1. Stay home from work and school. And stay away from other public places. If you must go out, avoid using any kind of public transportation, ridesharing, or taxis. 2. Monitor your symptoms carefully. If your symptoms get worse, call your healthcare provider immediately. 3. Get rest and stay hydrated. 4. If you have a medical appointment, call the healthcare provider ahead of time and tell them that you have or may have COVID-19. 5. For medical emergencies, call 911 and notify the dispatch personnel that you have or may have COVID-19. 6. Cover your cough and sneezes with a tissue or use the inside of your elbow. 7. Wash your hands often with soap and water for at least 20 seconds or clean your hands with an alcohol-based hand sanitizer that contains at least 60% alcohol. 8. As much as possible, stay in a specific room and away from other people in your home. Also, you should use a separate bathroom, if available. If you need to be around other people in or outside of the home, wear a mask. 9. Avoid sharing personal items with other people in your household, like dishes, towels, and bedding. 10. Clean all surfaces that are touched often, like counters, tabletops, and doorknobs. Use household cleaning sprays or wipes according to the label instructions. [email or web address] 09/15/2018 This information is not intended to replace advice given to you by your health care provider. Make sure you discuss any questions you have with your health care provider.  Safe Medications in Pregnancy   Backache/Headache: Tylenol: 2 regular strength every 4 hours OR              2 Extra strength every 6 hours  Colds/Coughs/Allergies: Benadryl (alcohol free) 25 mg every 6 hours as needed Breath right strips Claritin Cepacol throat lozenges Chloraseptic throat spray Cold-Eeze- up to three times per  day Cough drops, alcohol free Flonase (by prescription only) Guaifenesin Mucinex Robitussin DM (plain only, alcohol free) Saline nasal spray/drops Sudafed (pseudoephedrine) & Actifed ** use only after [redacted] weeks gestation and if you do not have high blood pressure Tylenol Vicks Vaporub Zinc lozenges Zyrtec   Constipation: Colace Ducolax suppositories Fleet enema Glycerin suppositories Metamucil Milk of magnesia Miralax Senokot Smooth move tea  Diarrhea: Kaopectate Imodium A-D  *NO pepto Bismol Nausea/Vomiting:  Bonine Dramamine Emetrol Ginger extract Sea bands Meclizine  Nausea medication to take during pregnancy:  Unisom (doxylamine succinate 25 mg tablets) Take one tablet daily at bedtime. If symptoms are not adequately controlled, the dose can be increased to a maximum recommended dose of two tablets daily (1/2 tablet in the morning, 1/2 tablet mid-afternoon and one at bedtime). Vitamin B6 100mg  tablets. Take one tablet twice a day (up to 200 mg per day).  **If taking multiple medications, please check labels to avoid duplicating the same active ingredients **take medication as directed on the label ** Do not exceed 4000 mg of tylenol in 24 hours **Do not take medications that contain aspirin or ibuprofen

## 2020-03-14 NOTE — MAU Provider Note (Signed)
History     CSN: [id number]  Arrival date and time: 03/13/20 2224   Event Date/Time   First Provider Initiated Contact with Patient 03/14/20 0153      Chief Complaint  Patient presents with  . Headache   Cindy Fletcher is a 24 y.o. G2P0 at [redacted]w[redacted]d who presents to MAU with covid symptoms. Patient reports symptoms started occurring on Monday morning. Reports symptoms initially started out as HA and cough then on Tuesday started having congestion, back pain and fever of 102.6. +FM. Denies abdominal pain, contractions or cramping. Denies vaginal bleeding or discharge. Did not receive Covid vaccination.    OB History    Gravida  2   Para      Term      Preterm      AB  1   Living  0     SAB  1   IAB      Ectopic      Multiple      Live Births              Past Medical History:  Diagnosis Date  . Anemia   . Asthma     Past Surgical History:  Procedure Laterality Date  . NO PAST SURGERIES      History reviewed. No pertinent family history.  Social History   Tobacco Use  . Smoking status: Never Smoker  . Smokeless tobacco: Never Used  Vaping Use  . Vaping Use: Never used  Substance Use Topics  . Alcohol use: Not Currently  . Drug use: Not Currently    Allergies: No Known Allergies  Medications Prior to Admission  Medication Sig Dispense Refill Last Dose  . Blood Pressure Monitoring (BLOOD PRESSURE CUFF) MISC 1 Device by Does not apply route once a week. (Patient not taking: Reported on 11/07/2019) 1 each 0   . cyclobenzaprine (FLEXERIL) 10 MG tablet Take 1 tablet (10 mg total) by mouth 3 (three) times daily as needed for muscle spasms. (Patient not taking: Reported on 12/05/2019) 30 tablet 0   . Doxylamine-Pyridoxine (DICLEGIS) 10-10 MG TBEC Take 2 tabs at bedtime. If needed, add another tab in the morning. If needed, add another tab in the afternoon, up to 4 tabs/day. (Patient not taking: Reported on 12/05/2019) 100 tablet 5   . Elastic Bandages &  Supports (COMFORT FIT MATERNITY SUPP MED) MISC 1 Device by Does not apply route daily. 1 each 0   . Prenatal Vit-Fe Phos-FA-Omega (VITAFOL GUMMIES) 3.33-0.333-34.8 MG CHEW Chew 3 each by mouth daily. 90 tablet 12     Review of Systems  Constitutional: Positive for chills and fever. Negative for fatigue.  HENT: Positive for congestion.   Respiratory: Positive for cough. Negative for shortness of breath and wheezing.   Cardiovascular: Negative.   Gastrointestinal: Negative.   Genitourinary: Negative.   Musculoskeletal: Positive for back pain.  Neurological: Positive for headaches. Negative for dizziness, syncope, weakness and light-headedness.  Psychiatric/Behavioral: Negative.    Physical Exam   Blood pressure 128/64, pulse (!) 117, temperature 98.5 F (36.9 C), temperature source Oral, resp. rate 18, height 5' 8.5" (1.74 m), weight 118.8 kg, last menstrual period 08/26/2019, SpO2 98 %, unknown if currently breastfeeding.  Physical Exam Vitals and nursing note reviewed.  HENT:     Head: Normocephalic.  Cardiovascular:     Rate and Rhythm: Normal rate and regular rhythm.  Pulmonary:     Effort: Pulmonary effort is normal. No respiratory distress.     Breath  sounds: Normal breath sounds. No wheezing.  Abdominal:     Palpations: Abdomen is soft. There is no mass.     Tenderness: There is no abdominal tenderness. There is no guarding.     Comments: Gravid appropriate for gestational age   Musculoskeletal:     Right lower leg: No edema.     Left lower leg: No edema.  Skin:    General: Skin is warm and dry.  Neurological:     Mental Status: She is alert and oriented to person, place, and time.  Psychiatric:        Mood and Affect: Mood normal.        Behavior: Behavior normal.        Thought Content: Thought content normal.    Fetal monitoring:  160/moderate/+accels/ no decel No UC   MAU Course  Procedures  MDM Orders Placed This Encounter  Procedures  . Resp Panel by  RT-PCR (Flu A&B, Covid) Nasopharyngeal Swab  . Urinalysis, Routine w reflex microscopic Urine, Clean Catch  . Airborne and Contact precautions  . Insert peripheral IV   Patient reports HA upon arrival to MAU - discussed HA cocktail with patient, discussed IV fluids for fever and tachycardia in addition to tylenol, patient declines treated for HA and IV at this time. COVID pending.   Results for orders placed or performed during the hospital encounter of 03/13/20 (from the past 24 hour(s))  Urinalysis, Routine w reflex microscopic Urine, Clean Catch     Status: Abnormal   Collection Time: 03/13/20 10:36 PM  Result Value Ref Range   Color, Urine STRAW (A) YELLOW   APPearance CLEAR CLEAR   Specific Gravity, Urine 1.008 1.005 - 1.030   pH 6.0 5.0 - 8.0   Glucose, UA NEGATIVE NEGATIVE mg/dL   Hgb urine dipstick NEGATIVE NEGATIVE   Bilirubin Urine NEGATIVE NEGATIVE   Ketones, ur NEGATIVE NEGATIVE mg/dL   Protein, ur NEGATIVE NEGATIVE mg/dL   Nitrite NEGATIVE NEGATIVE   Leukocytes,Ua NEGATIVE NEGATIVE  Resp Panel by RT-PCR (Flu A&B, Covid) Urine, Clean Catch     Status: Abnormal   Collection Time: 03/13/20 11:00 PM   Specimen: Urine, Clean Catch; Nasopharyngeal(NP) swabs in vial transport medium  Result Value Ref Range   SARS Coronavirus 2 by RT PCR POSITIVE (A) NEGATIVE   Influenza A by PCR NEGATIVE NEGATIVE   Influenza B by PCR NEGATIVE NEGATIVE   Educated and discussed with patient COVID positive result. Answered patient and partners questions. Discussed with patient mAb infusion that can be given in MAU prior to discharge home - discussed risks and benefits of infusion. Given patient time alone with partner to discuss treatment.   Patient declines treatment after consideration. Discussed with patient at home medications and safe medications during pregnancy for symptoms. Educated with patient that quarantine needs to occur for 10 days and reasons to present back to MAU prior. Encouraged  friends/family/partner to get tested as soon as possible and quarantine until results. Patient and partner verbalizes understanding.   Discussed reasons to return to MAU. Follow up in the office. Return to MAU as needed. Pt stable at time of discharge.   Assessment and Plan   1. COVID-19 affecting pregnancy in third trimester   2. [redacted] weeks gestation of pregnancy   3. NST (non-stress test) reactive    Discharge home Follow up in the office for prenatal care Hydration, rest, FKC and quarantine  Return to MAU as needed for reasons discussed and/or emergencies  Follow-up Information    [facility] Follow up.   Specialty: Obstetrics and Gynecology Contact information: [address], [address] [place] [address] [phone]             Allergies as of 03/14/2020   No Known Allergies     Medication List    TAKE these medications   Blood Pressure Cuff Misc 1 Device by Does not apply route once a week.   Comfort Fit Maternity Supp Med Misc 1 Device by Does not apply route daily.   cyclobenzaprine 10 MG tablet Commonly known as: FLEXERIL Take 1 tablet (10 mg total) by mouth 3 (three) times daily as needed for muscle spasms.   Doxylamine-Pyridoxine 10-10 MG Tbec Commonly known as: Diclegis Take 2 tabs at bedtime. If needed, add another tab in the morning. If needed, add another tab in the afternoon, up to 4 tabs/day.   Vitafol Gummies 3.33-0.333-34.8 MG Chew Chew 3 each by mouth daily.       Sharyon Cable 03/14/2020, 7:17 AM

## 2020-03-17 NOTE — L&D Delivery Note (Addendum)
OB Delivery Note  Cindy Fletcher is a 25 y.o. s/p vaginal delivery at [redacted]w[redacted]d.  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    ROM: 9h 55m, AROM with moderate meconium stained fluid GBS Status: positive, adequately treated with PCN Maximum Maternal Temperature: 99.81F  Labor Progress: Pt presented on 3/4 for IOL. She was treated with cytotec x5 and FB for cervical ripening. Pitocin was also initiated and AROM was performed for moderate meconium stained fluid. Pt then was noted to have complete cervical dilation and delivered without complication as noted below.   Delivery Date/Time: 05/20/2020 at 1008 Delivery: Called to room and patient was complete and pushing. Head delivered LOA with nuchal cord x1. Shoulder and body delivered in usual fashion. Infant with spontaneous cry, placed on mother's abdomen, dried and stimulated. Cord clamped x 2 after 1-minute delay, and cut by FOB under my direct supervision. Cord blood drawn. Placenta delivered spontaneously with gentle cord traction. Fundus firm with massage and Pitocin. Labia, perineum, vagina, and cervix were inspected; no lacerations visualized.    Placenta: intact, 3-vessel cord, sent to L&D Complications: none Lacerations: none EBL: 160 ml Analgesia: epidural   Infant: female  APGARs 9 & 9  weight per medical record   Maury Dus, MD  I was present for the entire delivery of baby and placenta and inspection of perineum and agree with above.  Katrinka Blazing, [place], CNM 05/20/2020 5:27 PM

## 2020-03-19 ENCOUNTER — Telehealth (INDEPENDENT_AMBULATORY_CARE_PROVIDER_SITE_OTHER): Payer: 59 | Admitting: Obstetrics

## 2020-03-19 ENCOUNTER — Encounter: Payer: Self-pay | Admitting: Obstetrics

## 2020-03-19 DIAGNOSIS — U071 COVID-19: Secondary | ICD-10-CM

## 2020-03-19 DIAGNOSIS — Z3480 Encounter for supervision of other normal pregnancy, unspecified trimester: Secondary | ICD-10-CM

## 2020-03-19 DIAGNOSIS — O98513 Other viral diseases complicating pregnancy, third trimester: Secondary | ICD-10-CM

## 2020-03-19 DIAGNOSIS — Z3A29 29 weeks gestation of pregnancy: Secondary | ICD-10-CM

## 2020-03-19 NOTE — Progress Notes (Signed)
Virtual Visit via Telephone Note  I connected with Cindy Fletcher on 03/19/20 at 10:00 AM EST by telephone and verified that I am speaking with the correct person using two identifiers.  Pt +Covid states she's not 100% but sx's are improving. Pt does not have a BP cuff. She agrees to purchase one.

## 2020-03-19 NOTE — Progress Notes (Signed)
   OBSTETRICS PRENATAL VIRTUAL VISIT ENCOUNTER NOTE  Provider location: [facility] [place]   I connected with Cindy Fletcher on 03/19/20 at 10:00 AM EST by MyChart Video Encounter at home and verified that I am speaking with the correct person using two identifiers.   I discussed the limitations, risks, security and privacy concerns of performing an evaluation and management service virtually and the availability of in person appointments. I also discussed with the patient that there may be a patient responsible charge related to this service. The patient expressed understanding and agreed to proceed.  Subjective:  Cindy Fletcher is a 25 y.o. G2P0010 at [redacted]w[redacted]d being seen today for ongoing prenatal care.  She is currently monitored for the following issues for this low-risk pregnancy and has Miscarriage; Supervision of normal pregnancy, antepartum; Short cervix in non-gravid uterus; Nausea and vomiting during pregnancy prior to [redacted] weeks gestation; ASCUS of cervix with negative high risk HPV; and COVID-19 affecting pregnancy in third trimester on their problem list.  Patient reports loss the senses of smell and taste, and mild URI.  Contractions: Not present. Vag. Bleeding: None.  Movement: Present. Denies any leaking of fluid.   The following portions of the patient's history were reviewed and updated as appropriate: allergies, current medications, past family history, past medical history, past social history, past surgical history and problem list.   Objective:  There were no vitals filed for this visit.  Fetal Status:     Movement: Present     General:  Alert, oriented and cooperative. Patient is in no acute distress.  Respiratory: Normal respiratory effort, no problems with respiration noted  Mental Status: Normal mood and affect. Normal behavior. Normal judgment and thought content.  Rest of physical exam deferred due to type of encounter  Imaging: No results  found.  Assessment and Plan:  Pregnancy: G2P0010 at [redacted]w[redacted]d  1. Supervision of other normal pregnancy, antepartum  2. COVID-19 affecting pregnancy in third trimester - mild symptoms, routine protocol of quarantine, rest, plenty of fluids, etc  Preterm labor symptoms and general obstetric precautions including but not limited to vaginal bleeding, contractions, leaking of fluid and fetal movement were reviewed in detail with the patient. I discussed the assessment and treatment plan with the patient. The patient was provided an opportunity to ask questions and all were answered. The patient agreed with the plan and demonstrated an understanding of the instructions. The patient was advised to call back or seek an in-person office evaluation/go to MAU at [id number] [facility] for any urgent or concerning symptoms. Please refer to After Visit Summary for other counseling recommendations.   I provided 15 minutes of face-to-face time during this encounter.  Return in about 2 weeks (around 04/02/2020) for MyChart.  No future appointments.  Coral Ceo, MD [facility] for [facility], [facility] 03/19/20

## 2020-03-19 NOTE — Addendum Note (Signed)
Addended by: Coral Ceo A on: 03/19/2020 02:26 PM   Modules accepted: Level of Service

## 2020-04-02 ENCOUNTER — Telehealth: Payer: 59 | Admitting: Advanced Practice Midwife

## 2020-04-03 ENCOUNTER — Telehealth: Payer: 59 | Admitting: Nurse Practitioner

## 2020-04-09 ENCOUNTER — Encounter: Payer: Self-pay | Admitting: Advanced Practice Midwife

## 2020-04-09 ENCOUNTER — Ambulatory Visit (INDEPENDENT_AMBULATORY_CARE_PROVIDER_SITE_OTHER): Payer: BC Managed Care – PPO | Admitting: Advanced Practice Midwife

## 2020-04-09 ENCOUNTER — Other Ambulatory Visit: Payer: Self-pay

## 2020-04-09 VITALS — BP 146/85 | HR 118 | Wt 266.0 lb

## 2020-04-09 DIAGNOSIS — O0933 Supervision of pregnancy with insufficient antenatal care, third trimester: Secondary | ICD-10-CM

## 2020-04-09 DIAGNOSIS — U071 COVID-19: Secondary | ICD-10-CM

## 2020-04-09 DIAGNOSIS — R030 Elevated blood-pressure reading, without diagnosis of hypertension: Secondary | ICD-10-CM | POA: Diagnosis not present

## 2020-04-09 DIAGNOSIS — Z3A32 32 weeks gestation of pregnancy: Secondary | ICD-10-CM

## 2020-04-09 DIAGNOSIS — Z3480 Encounter for supervision of other normal pregnancy, unspecified trimester: Secondary | ICD-10-CM

## 2020-04-09 DIAGNOSIS — O98513 Other viral diseases complicating pregnancy, third trimester: Secondary | ICD-10-CM

## 2020-04-09 DIAGNOSIS — O10913 Unspecified pre-existing hypertension complicating pregnancy, third trimester: Secondary | ICD-10-CM | POA: Insufficient documentation

## 2020-04-09 NOTE — Progress Notes (Signed)
ROB 32 wks  Last seen 01/02/2020 has not had 2 hr GTT.   Pt wants to discuss baby's growth and U/S   B/P elevated no HA's or visual changes per pt.

## 2020-04-09 NOTE — Patient Instructions (Signed)

## 2020-04-09 NOTE — Progress Notes (Addendum)
   PRENATAL VISIT NOTE  Subjective:  Cindy Fletcher is a 25 y.o. G2P0010 at 33w3dbeing seen today for ongoing prenatal care.  She is currently monitored for the following issues for this low-risk pregnancy and has Miscarriage; Supervision of normal pregnancy, antepartum; Short cervix in non-gravid uterus; Nausea and vomiting during pregnancy prior to [redacted] weeks gestation; ASCUS of cervix with negative high risk HPV; COVID-19 affecting pregnancy in third trimester; Limited prenatal care in third trimester; and Chronic hypertension complicating or reason for care during pregnancy, third trimester on their problem list.  Patient reports no complaints.  Contractions: Irritability. Vag. Bleeding: None.  Movement: Present. Denies leaking of fluid.   The following portions of the patient's history were reviewed and updated as appropriate: allergies, current medications, past family history, past medical history, past social history, past surgical history and problem list.   Objective:   Vitals:   04/09/20 1537  BP: (!) 146/85  Pulse: (!) 118  Weight: 266 lb (120.7 kg)    Fetal Status: Fetal Heart Rate (bpm): 154   Movement: Present     General:  Alert, oriented and cooperative. Patient is in no acute distress.  Skin: Skin is warm and dry. No rash noted.   Cardiovascular: Normal heart rate noted  Respiratory: Normal respiratory effort, no problems with respiration noted  Abdomen: Soft, gravid, appropriate for gestational age.  Pain/Pressure: Absent     Pelvic: Cervical exam deferred        Extremities: Normal range of motion.  Edema: None  Mental Status: Normal mood and affect. Normal behavior. Normal judgment and thought content.   Assessment and Plan:  Pregnancy: G2P0010 at 383w3d. Elevated BP without diagnosis of hypertension --Pt with isolated BP in early pregnancy and no in office visits or home BPs reported since  - CBC - Comp Met (CMET) - Protein / creatinine ratio, urine  2.  Limited prenatal care in third trimester   3. [redacted] weeks gestation of pregnancy   4. Supervision of other normal pregnancy, antepartum --Anticipatory guidance about next visits/weeks of pregnancy given. --Next visit in 2 weeks with GTT, BP check this week,, see below  5. COVID-19 affecting pregnancy in third trimester --Mild Covid symptoms, dx 03/14/20, now resolved  6. Chronic hypertension complicating or reason for care during pregnancy, third trimester --Documented HTN during SAB in 07/2019 and again on 09/29/2019 at pregnancy confirmation visit in first trimester of this pregnancy.  --Pt with limited care, missed appt on 01/30/20, then virtual visit due to [place] 03/19/20 with no home BP taken.   --No s/sx of PEC today --PEC labs drawn today, BP check in 2 days, consider starting antihypertensives for [facility] remains elevated --PEC precautions/reasons to seek care reviewed --Since pt CHTN without medications at this time, growth US[place]ardered but not BPP, if BP remains elevated, pt will need to start antenatal testing  Preterm labor symptoms and general obstetric precautions including but not limited to vaginal bleeding, contractions, leaking of fluid and fetal movement were reviewed in detail with the patient. Please refer to After Visit Summary for other counseling recommendations.   Return in about 2 weeks (around 04/23/2020).  Future Appointments  Date Time Provider [place]/10/2020  8:15 AM [facility]  04/24/2020  9:55 AM [place], LiKathie DikeCNM [facility] None    LiFatima BlankCNM

## 2020-04-10 LAB — CBC
Hematocrit: 32.3 % — ABNORMAL LOW (ref 34.0–46.6)
Hemoglobin: 10.6 g/dL — ABNORMAL LOW (ref 11.1–15.9)
MCH: 28.4 pg (ref 26.6–33.0)
MCHC: 32.8 g/dL (ref 31.5–35.7)
MCV: 87 fL (ref 79–97)
Platelets: 275 10*3/uL (ref 150–450)
RBC: 3.73 x10E6/uL — ABNORMAL LOW (ref 3.77–5.28)
RDW: 13.2 % (ref 11.7–15.4)
WBC: 7.3 10*3/uL (ref 3.4–10.8)

## 2020-04-10 LAB — COMPREHENSIVE METABOLIC PANEL
ALT: 8 IU/L (ref 0–32)
AST: 19 IU/L (ref 0–40)
Albumin/Globulin Ratio: 1.4 (ref 1.2–2.2)
Albumin: 3.7 g/dL — ABNORMAL LOW (ref 3.9–5.0)
Alkaline Phosphatase: 95 IU/L (ref 44–121)
BUN/Creatinine Ratio: 10 (ref 9–23)
BUN: 5 mg/dL — ABNORMAL LOW (ref 6–20)
Bilirubin Total: 0.5 mg/dL (ref 0.0–1.2)
CO2: 22 mmol/L (ref 20–29)
Calcium: 9 mg/dL (ref 8.7–10.2)
Chloride: 103 mmol/L (ref 96–106)
Creatinine, Ser: 0.48 mg/dL — ABNORMAL LOW (ref 0.57–1.00)
GFR calc Af Amer: 159 mL/min/{1.73_m2} (ref >59)
GFR calc non Af Amer: 138 mL/min/{1.73_m2} (ref >59)
Globulin, Total: 2.7 g/dL (ref 1.5–4.5)
Glucose: 134 mg/dL — ABNORMAL HIGH (ref 65–99)
Potassium: 3.8 mmol/L (ref 3.5–5.2)
Sodium: 140 mmol/L (ref 134–144)
Total Protein: 6.4 g/dL (ref 6.0–8.5)

## 2020-04-10 LAB — PROTEIN / CREATININE RATIO, URINE
Creatinine, Urine: 80.1 mg/dL
Protein, Ur: 8 mg/dL
Protein/Creat Ratio: 100 mg/g creat (ref 0–200)

## 2020-04-12 ENCOUNTER — Ambulatory Visit (INDEPENDENT_AMBULATORY_CARE_PROVIDER_SITE_OTHER): Payer: 59

## 2020-04-12 ENCOUNTER — Other Ambulatory Visit: Payer: Self-pay

## 2020-04-12 DIAGNOSIS — U071 COVID-19: Secondary | ICD-10-CM

## 2020-04-12 DIAGNOSIS — O98513 Other viral diseases complicating pregnancy, third trimester: Secondary | ICD-10-CM

## 2020-04-12 DIAGNOSIS — Z3480 Encounter for supervision of other normal pregnancy, unspecified trimester: Secondary | ICD-10-CM

## 2020-04-12 NOTE — Progress Notes (Signed)
Subjective:  Cindy Fletcher is a 25 y.o. female here for BP check.  Pt does not have a BP cuff - private ins.   Hypertension ROS: taking medications as instructed, no medication side effects noted, no TIA's, no chest pain on exertion, no dyspnea on exertion and no swelling of ankles.   Objective:  BP 127/81   Pulse 90   LMP 08/26/2019   PEC WNL on 04/09/20 Appearance alert, well appearing, and in no distress. General exam BP noted to be well controlled today in office.    Assessment:   Blood Pressure stable.   Plan:  BP cuff given - check BP readings regularly, enter BP readings into [employer]  Keep upcoming appt

## 2020-04-12 NOTE — Patient Instructions (Signed)
Hypertension During Pregnancy Hypertension is also called high blood pressure. High blood pressure means that the force of the blood moving in your body is high enough to cause problems for you and your baby. Different types of high blood pressure can happen during pregnancy. The types are:  High blood pressure before you got pregnant. This is called chronic hypertension.  This can continue during your pregnancy. Your doctor will want to keep checking your blood pressure. You may need medicine to control your blood pressure while you are pregnant. You will need follow-up visits after you have your baby.  High blood pressure that goes up during pregnancy when it was normal before. This is called gestational hypertension. It will often get better after you have your baby, but your doctor will need to watch your blood pressure to make sure that it is getting better.  You may develop high blood pressure after giving birth. This is called postpartum hypertension. This often occurs within 48 hours after childbirth but may occur up to 6 weeks after giving birth. Very high blood pressure during pregnancy is an emergency that needs treatment right away. How does this affect me? If you have high blood pressure during pregnancy, you have a higher chance of developing high blood pressure:  As you get older.  If you get pregnant again. In some cases, high blood pressure during pregnancy can cause:  Stroke.  Heart attack.  Damage to the kidneys, lungs, or liver.  Preeclampsia.  HELLP syndrome.  Seizures.  Problems with the placenta. How does this affect my baby? Your baby may:  Be born early.  Not weigh as much as he or she should.  Not handle labor well, leading to a C-section. This condition may also result in a baby's death before birth (stillbirth). What are the risks?  Having high blood pressure during a past pregnancy.  Being overweight.  Being age 35 or older.  Being pregnant  for the first time.  Being pregnant with more than one baby.  Becoming pregnant using fertility methods, such as IVF.  Having other problems, such as diabetes or kidney disease. What can I do to lower my risk?  Keep a healthy weight.  Eat a healthy diet.  Follow what your doctor tells you about treating any medical problems that you had before you got pregnant. It is very important to go to all of your doctor visits. Your doctor will check your blood pressure and make sure that your pregnancy is progressing as it should. Treatment should start early if a problem is found.   How is this treated? Treatment for high blood pressure during pregnancy can vary. It depends on the type of high blood pressure you have and how serious it is.  If you were taking medicine for your blood pressure before you got pregnant, talk with your doctor. You may need to change the medicine during pregnancy if it is not safe for your baby.  If your blood pressure goes up during pregnancy, your doctor may order medicine to treat this.  If you are at risk for preeclampsia, your doctor may tell you to take a low-dose aspirin while you are pregnant.  If you have very high blood pressure, you may need to stay in the hospital so you and your baby can be watched closely. You may also need to take medicine to lower your blood pressure.  In some cases, if your condition gets worse, you may need to have your baby early.   Follow these instructions at home: Eating and drinking  Drink enough fluid to keep your pee (urine) pale yellow.  Avoid caffeine.   Lifestyle  Do not smoke or use any products that contain nicotine or tobacco. If you need help quitting, ask your doctor.  Do not use alcohol or drugs.  Avoid stress.  Rest and get plenty of sleep.  Regular exercise can help. Ask your doctor what kinds of exercise are best for you. General instructions  Take over-the-counter and prescription medicines only as  told by your doctor.  Keep all prenatal and follow-up visits. Contact a doctor if:  You have symptoms that your doctor told you to watch for, such as: ? Headaches. ? A feeling like you may vomit (nausea). ? Vomiting. ? Belly (abdominal) pain. ? Feeling dizzy or light-headed. Get help right away if:  You have symptoms of serious problems, such as: ? Very bad belly pain that does not get better with treatment. ? A very bad headache that does not get better. ? Blurry vision. ? Double vision. ? Vomiting that does not get better. ? Sudden, fast weight gain. ? Sudden swelling in your hands, ankles, or face. ? Bleeding from your vagina. ? Blood in your pee. ? Shortness of breath. ? Chest pain. ? Weakness on one side of your body. ? Trouble talking.  Your baby is not moving as much as usual. These symptoms may be an emergency. Get help right away. Call your local emergency services (911 in the [place]  Do not wait to see if the symptoms will go away.  Do not drive yourself to the hospital. Summary  High blood pressure is also called hypertension.  High blood pressure means that the force of the blood moving in your body is high enough to cause problems for you and your baby.  Get help right away if you have symptoms of serious problems due to high blood pressure.  Keep all prenatal and follow-up visits. This information is not intended to replace advice given to you by your health care provider. Make sure you discuss any questions you have with your health care provider. Document Revised: 11/24/2019 Document Reviewed: 11/24/2019 Elsevier Patient Education  2021 Elsevier Inc.  

## 2020-04-12 NOTE — Progress Notes (Signed)
Patient was assessed and managed by nursing staff during this encounter. I have reviewed the chart and agree with the documentation and plan. I have also made any necessary editorial changes.  Catalina Antigua, MD 04/12/2020 1:46 PM

## 2020-04-24 ENCOUNTER — Other Ambulatory Visit: Payer: 59

## 2020-04-24 ENCOUNTER — Other Ambulatory Visit: Payer: Self-pay

## 2020-04-24 ENCOUNTER — Ambulatory Visit (INDEPENDENT_AMBULATORY_CARE_PROVIDER_SITE_OTHER): Payer: 59 | Admitting: Advanced Practice Midwife

## 2020-04-24 VITALS — BP 143/75 | HR 99 | Wt 270.0 lb

## 2020-04-24 DIAGNOSIS — O99891 Other specified diseases and conditions complicating pregnancy: Secondary | ICD-10-CM | POA: Diagnosis not present

## 2020-04-24 DIAGNOSIS — O0990 Supervision of high risk pregnancy, unspecified, unspecified trimester: Secondary | ICD-10-CM

## 2020-04-24 DIAGNOSIS — O10913 Unspecified pre-existing hypertension complicating pregnancy, third trimester: Secondary | ICD-10-CM

## 2020-04-24 DIAGNOSIS — O98513 Other viral diseases complicating pregnancy, third trimester: Secondary | ICD-10-CM

## 2020-04-24 DIAGNOSIS — Z3A34 34 weeks gestation of pregnancy: Secondary | ICD-10-CM

## 2020-04-24 DIAGNOSIS — M549 Dorsalgia, unspecified: Secondary | ICD-10-CM

## 2020-04-24 DIAGNOSIS — U071 COVID-19: Secondary | ICD-10-CM

## 2020-04-24 DIAGNOSIS — O0993 Supervision of high risk pregnancy, unspecified, third trimester: Secondary | ICD-10-CM

## 2020-04-24 NOTE — Progress Notes (Signed)
   PRENATAL VISIT NOTE  Subjective:  Cindy Fletcher is a 25 y.o. G2P0010 at [redacted]w[redacted]d being seen today for ongoing prenatal care.  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  Patient reports backache.  Contractions: Irritability. Vag. Bleeding: None.  Movement: Present. Denies leaking of fluid.   The following portions of the patient's history were reviewed and updated as appropriate: allergies, current medications, past family history, past medical history, past social history, past surgical history and problem list.   Objective:   Vitals:   04/24/20 0944  BP: (!) 143/75  Pulse: 99  Weight: 270 lb (122.5 kg)    Fetal Status: Fetal Heart Rate (bpm): 139 Fundal Height: 35 cm Movement: Present     General:  Alert, oriented and cooperative. Patient is in no acute distress.  Skin: Skin is warm and dry. No rash noted.   Cardiovascular: Normal heart rate noted  Respiratory: Normal respiratory effort, no problems with respiration noted  Abdomen: Soft, gravid, appropriate for gestational age.  Pain/Pressure: Absent     Pelvic: Cervical exam deferred        Extremities: Normal range of motion.  Edema: None  Mental Status: Normal mood and affect. Normal behavior. Normal judgment and thought content.   Assessment and Plan:  Pregnancy: G2P0010 at [redacted]w[redacted]d  1. [redacted] weeks gestation of pregnancy - Glucose Tolerance, 2 Hours w/1 Hour  2. Supervision of high risk pregnancy in third trimester --Anticipatory guidance about next visits/weeks of pregnancy  given. --Next appt in 2 weeks in office for GBS  - RPR - CBC - HIV antibody (with reflex)  3. Chronic hypertension complicating or reason for care during pregnancy, third trimester --BP slightly elevated today, c/w previous office appts.  Home BP 120s/70s consistently per pt.   --No s/sx of PEC --Consult Dr Donavan Foil, P/C ratio today, start antenatal testing, no medications started with borderline BPs today --Growth [place] tomorrow, 04/24/20, add BPP and continue weekly - [place] MFM FETAL BPP WO NON STRESS; Future - Protein / creatinine ratio, urine  4. Back pain affecting pregnancy in third trimester --Rest/ice/heat/warm bath/Tylenol/pregnancy support belt  - Culture, OB Urine  6. COVID-19 affecting pregnancy in third trimester --Begin antenatal testing for [facility], see above  Preterm labor symptoms and general obstetric precautions including but not limited to vaginal bleeding, contractions, leaking of fluid and fetal movement were reviewed in detail with the patient. Please refer to After Visit Summary for other counseling recommendations.   No follow-ups on file.  Future Appointments  Date Time Provider Department Center  04/25/2020  7:45 AM [facility] NURSE [facility]  04/25/2020  8:00 AM [facility] US1 [facility]    Sharen Counter, CNM

## 2020-04-24 NOTE — Progress Notes (Signed)
+   Fetal movement. Pt states BP readings at home in the 120s/75-85.

## 2020-04-25 ENCOUNTER — Ambulatory Visit: Payer: 59 | Admitting: *Deleted

## 2020-04-25 ENCOUNTER — Other Ambulatory Visit: Payer: Self-pay | Admitting: *Deleted

## 2020-04-25 ENCOUNTER — Ambulatory Visit: Payer: 59 | Attending: Obstetrics and Gynecology

## 2020-04-25 ENCOUNTER — Other Ambulatory Visit: Payer: Self-pay | Admitting: Advanced Practice Midwife

## 2020-04-25 ENCOUNTER — Encounter: Payer: Self-pay | Admitting: *Deleted

## 2020-04-25 DIAGNOSIS — O10913 Unspecified pre-existing hypertension complicating pregnancy, third trimester: Secondary | ICD-10-CM

## 2020-04-25 DIAGNOSIS — O283 Abnormal ultrasonic finding on antenatal screening of mother: Secondary | ICD-10-CM

## 2020-04-25 DIAGNOSIS — O98513 Other viral diseases complicating pregnancy, third trimester: Secondary | ICD-10-CM | POA: Insufficient documentation

## 2020-04-25 DIAGNOSIS — U071 COVID-19: Secondary | ICD-10-CM | POA: Diagnosis not present

## 2020-04-25 DIAGNOSIS — O0990 Supervision of high risk pregnancy, unspecified, unspecified trimester: Secondary | ICD-10-CM | POA: Diagnosis not present

## 2020-04-25 DIAGNOSIS — Z8616 Personal history of COVID-19: Secondary | ICD-10-CM

## 2020-04-25 DIAGNOSIS — Z362 Encounter for other antenatal screening follow-up: Secondary | ICD-10-CM | POA: Diagnosis not present

## 2020-04-25 DIAGNOSIS — Z3A34 34 weeks gestation of pregnancy: Secondary | ICD-10-CM

## 2020-04-25 LAB — CBC
Hematocrit: 31.2 % — ABNORMAL LOW (ref 34.0–46.6)
Hemoglobin: 10.2 g/dL — ABNORMAL LOW (ref 11.1–15.9)
MCH: 28.2 pg (ref 26.6–33.0)
MCHC: 32.7 g/dL (ref 31.5–35.7)
MCV: 86 fL (ref 79–97)
Platelets: 260 10*3/uL (ref 150–450)
RBC: 3.62 x10E6/uL — ABNORMAL LOW (ref 3.77–5.28)
RDW: 13.2 % (ref 11.7–15.4)
WBC: 7.7 10*3/uL (ref 3.4–10.8)

## 2020-04-25 LAB — RPR: RPR Ser Ql: NONREACTIVE

## 2020-04-25 LAB — PROTEIN / CREATININE RATIO, URINE
Creatinine, Urine: 52.4 mg/dL
Protein, Ur: 10.7 mg/dL
Protein/Creat Ratio: 204 mg/g creat — ABNORMAL HIGH (ref 0–200)

## 2020-04-25 LAB — GLUCOSE TOLERANCE, 2 HOURS W/ 1HR
Glucose, 1 hour: 126 mg/dL (ref 65–179)
Glucose, 2 hour: 83 mg/dL (ref 65–152)
Glucose, Fasting: 106 mg/dL — ABNORMAL HIGH (ref 65–91)

## 2020-04-25 LAB — HIV ANTIBODY (ROUTINE TESTING W REFLEX): HIV Screen 4th Generation wRfx: NONREACTIVE

## 2020-04-25 NOTE — Procedures (Signed)
Avabella Wailes 04-24-1995 [redacted]w[redacted]d  Fetus A Non-Stress Test Interpretation for 04/25/20  Indication: Unsatisfactory BPP  Fetal Heart Rate A Mode: External Baseline Rate (A): 150 bpm Variability: Moderate Accelerations: 15 x 15 Decelerations: None Multiple birth?: No  Uterine Activity Mode: Palpation,Toco Contraction Frequency (min): occ with ui Contraction Duration (sec): 40 Contraction Quality: Mild Resting Tone Palpated: Relaxed Resting Time: Adequate  Interpretation (Fetal Testing) Nonstress Test Interpretation: Reactive Overall Impression: Reassuring for gestational age Comments: Dr. Judeth Cornfield reviewed tracing.

## 2020-04-26 LAB — URINE CULTURE, OB REFLEX

## 2020-04-26 LAB — CULTURE, OB URINE

## 2020-05-01 ENCOUNTER — Other Ambulatory Visit: Payer: Self-pay | Admitting: Advanced Practice Midwife

## 2020-05-01 DIAGNOSIS — O24410 Gestational diabetes mellitus in pregnancy, diet controlled: Secondary | ICD-10-CM

## 2020-05-01 DIAGNOSIS — O24419 Gestational diabetes mellitus in pregnancy, unspecified control: Secondary | ICD-10-CM | POA: Insufficient documentation

## 2020-05-01 NOTE — Progress Notes (Signed)
Info sent to pt about GDM, order placed for diabetic education

## 2020-05-02 ENCOUNTER — Other Ambulatory Visit: Payer: Self-pay

## 2020-05-02 ENCOUNTER — Encounter: Payer: Self-pay | Admitting: *Deleted

## 2020-05-02 ENCOUNTER — Ambulatory Visit: Payer: 59 | Attending: Obstetrics and Gynecology

## 2020-05-02 ENCOUNTER — Ambulatory Visit: Payer: 59 | Admitting: *Deleted

## 2020-05-02 DIAGNOSIS — Z8616 Personal history of COVID-19: Secondary | ICD-10-CM | POA: Diagnosis not present

## 2020-05-02 DIAGNOSIS — O0990 Supervision of high risk pregnancy, unspecified, unspecified trimester: Secondary | ICD-10-CM | POA: Diagnosis not present

## 2020-05-02 DIAGNOSIS — O99213 Obesity complicating pregnancy, third trimester: Secondary | ICD-10-CM

## 2020-05-02 DIAGNOSIS — O98513 Other viral diseases complicating pregnancy, third trimester: Secondary | ICD-10-CM | POA: Diagnosis not present

## 2020-05-02 DIAGNOSIS — U071 COVID-19: Secondary | ICD-10-CM | POA: Insufficient documentation

## 2020-05-02 DIAGNOSIS — O10913 Unspecified pre-existing hypertension complicating pregnancy, third trimester: Secondary | ICD-10-CM | POA: Insufficient documentation

## 2020-05-02 DIAGNOSIS — Z3A35 35 weeks gestation of pregnancy: Secondary | ICD-10-CM

## 2020-05-02 DIAGNOSIS — E669 Obesity, unspecified: Secondary | ICD-10-CM

## 2020-05-02 DIAGNOSIS — O133 Gestational [pregnancy-induced] hypertension without significant proteinuria, third trimester: Secondary | ICD-10-CM

## 2020-05-02 DIAGNOSIS — O24419 Gestational diabetes mellitus in pregnancy, unspecified control: Secondary | ICD-10-CM

## 2020-05-08 ENCOUNTER — Other Ambulatory Visit (HOSPITAL_COMMUNITY)
Admission: RE | Admit: 2020-05-08 | Discharge: 2020-05-08 | Disposition: A | Discharge status: Home / Self Care | Payer: 59 | Source: Ambulatory Visit | Attending: Advanced Practice Midwife | Admitting: Advanced Practice Midwife

## 2020-05-08 ENCOUNTER — Inpatient Hospital Stay (HOSPITAL_COMMUNITY)
Admission: AD | Admit: 2020-05-08 | Discharge: 2020-05-08 | Disposition: A | Discharge status: Home / Self Care | Payer: 59 | Attending: Obstetrics and Gynecology | Admitting: Obstetrics and Gynecology

## 2020-05-08 ENCOUNTER — Ambulatory Visit (INDEPENDENT_AMBULATORY_CARE_PROVIDER_SITE_OTHER): Payer: BC Managed Care – PPO | Admitting: Advanced Practice Midwife

## 2020-05-08 ENCOUNTER — Other Ambulatory Visit: Payer: Self-pay

## 2020-05-08 VITALS — BP 131/83 | HR 106 | Wt 275.0 lb

## 2020-05-08 DIAGNOSIS — O26843 Uterine size-date discrepancy, third trimester: Secondary | ICD-10-CM

## 2020-05-08 DIAGNOSIS — O24410 Gestational diabetes mellitus in pregnancy, diet controlled: Secondary | ICD-10-CM | POA: Insufficient documentation

## 2020-05-08 DIAGNOSIS — O0993 Supervision of high risk pregnancy, unspecified, third trimester: Secondary | ICD-10-CM

## 2020-05-08 DIAGNOSIS — Z23 Encounter for immunization: Secondary | ICD-10-CM | POA: Diagnosis not present

## 2020-05-08 DIAGNOSIS — O368130 Decreased fetal movements, third trimester, not applicable or unspecified: Secondary | ICD-10-CM | POA: Insufficient documentation

## 2020-05-08 DIAGNOSIS — O10913 Unspecified pre-existing hypertension complicating pregnancy, third trimester: Secondary | ICD-10-CM

## 2020-05-08 DIAGNOSIS — Z3A36 36 weeks gestation of pregnancy: Secondary | ICD-10-CM

## 2020-05-08 DIAGNOSIS — Z3689 Encounter for other specified antenatal screening: Secondary | ICD-10-CM

## 2020-05-08 LAB — GLUCOSE, POCT (MANUAL RESULT ENTRY): POC Glucose: 90 mg/dl (ref 70–99)

## 2020-05-08 NOTE — MAU Note (Signed)
Cindy Fletcher is a 25 y.o. at [redacted]w[redacted]d here in MAU reporting: DFM for the past 2 weeks. In general is feeling some movement but is significantly less and has not felt any today.  Onset of complaint: ongoing  Pain score: 0/10  Vitals:   05/08/20 1250  BP: 125/79  Pulse: 96  Resp: 16  Temp: 98.6 F (37 C)  SpO2: 99%     FHT: 174  Lab orders placed from triage: none

## 2020-05-08 NOTE — Progress Notes (Signed)
PRENATAL VISIT NOTE  Subjective:  Cindy Fletcher is a 25 y.o. G2P0010 at [redacted]w[redacted]d being seen today for ongoing prenatal care.  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  Patient reports decreased fetal movement x 1 week.  Contractions: Not present. Vag. Bleeding: None.  Movement: Present. Denies leaking of fluid.   The following portions of the patient's history were reviewed and updated as appropriate: allergies, current medications, past family history, past medical history, past social history, past surgical history and problem list.   Objective:   Vitals:   05/08/20 1121  BP: 131/83  Pulse: (!) 106  Weight: 275 lb (124.7 kg)    Fetal Status: Fetal Heart Rate (bpm): 155   Movement: Present     General:  Alert, oriented and cooperative. Patient is in no acute distress.  Skin: Skin is warm and dry. No rash noted.   Cardiovascular: Normal heart rate noted  Respiratory: Normal respiratory effort, no problems with respiration noted  Abdomen: Soft, gravid, appropriate for gestational age.  Pain/Pressure: Present     Pelvic: Cervical exam deferred        Extremities: Normal range of motion.     Mental Status: Normal mood and affect. Normal behavior. Normal judgment and thought content.   Assessment and Plan:  Pregnancy: G2P0010 at [redacted]w[redacted]d 1. [redacted] weeks gestation of pregnancy  - Strep Gp B NAA - Cervicovaginal ancillary only( [place]) - Tdap vaccine greater than  or equal to 7yo IM  2. Diet controlled gestational diabetes mellitus (GDM) in third trimester --Pt has not gotten meter due to insurance coverage. Pt to contact [employer] and ask what meter, lancets, and test strips are covered and call the office so we can prescribe. --Spot check of glucose today is wnl at 90 within 2 hours of pt last meal.   3. Supervision of high risk pregnancy in third trimester --Anticipatory guidance about next visits/weeks of pregnancy given. --IOL at 39 weeks due to [facility] and GDM, IOL form completed, orders placed  4. Chronic hypertension complicating or reason for care during pregnancy, third trimester --BP wnl today, no meds  5. Uterine size date discrepancy pregnancy, third trimester --FH 65 today, [place] on 04/25/20 with EFW 41%tile but given GDM without meter at home, will add growth to BPP at MFM tomorrow.  6. Decreased fetal movements in third trimester, single or unspecified fetus --Pt with persistent DFM since last week.  She reports hours without movement, then only 1-2 movements in an hour if at rest and focused on movements. --Pt to MAU for NST/further management  Term labor symptoms and general obstetric precautions including but not limited to vaginal bleeding, contractions, leaking of fluid and fetal movement were reviewed in detail with the patient. Please refer to After Visit Summary for other counseling recommendations.   No follow-ups on file.  Future Appointments  Date Time Provider Department Center  05/09/2020  8:45 AM [facility] NURSE [facility]  05/09/2020  9:00 AM [facility] US1 [facility]  05/16/2020  7:30 AM [facility] NURSE [facility]  05/16/2020  7:45  AM [facility] US6 [facility]  05/16/2020  3:15 PM [facility] GDM CLASS [facility] NDM    Sharen Counter, CNM

## 2020-05-08 NOTE — Discharge Instructions (Signed)
Fetal Movement Counts Patient Name: ________________________________________________ Patient Due Date: ____________________  What is a fetal movement count? A fetal movement count is the number of times that you feel your baby move during a certain amount of time. This may also be called a fetal kick count. A fetal movement count is recommended for every pregnant woman. You may be asked to start counting fetal movements as early as week 28 of your pregnancy. Pay attention to when your baby is most active. You may notice your baby's sleep and wake cycles. You may also notice things that make your baby move more. You should do a fetal movement count:  When your baby is normally most active.  At the same time each day. A good time to count movements is while you are resting, after having something to eat and drink. How do I count fetal movements? 1. Find a quiet, comfortable area. Sit, or lie down on your side. 2. Write down the date, the start time and stop time, and the number of movements that you felt between those two times. Take this information with you to your health care visits. 3. Write down your start time when you feel the first movement. 4. Count kicks, flutters, swishes, rolls, and jabs. You should feel at least 10 movements. 5. You may stop counting after you have felt 10 movements, or if you have been counting for 2 hours. Write down the stop time. 6. If you do not feel 10 movements in 2 hours, contact your health care provider for further instructions. Your health care provider may want to do additional tests to assess your baby's well-being. Contact a health care provider if:  You feel fewer than 10 movements in 2 hours.  Your baby is not moving like he or she usually does. Date: ____________ Start time: ____________ Stop time: ____________ Movements: ____________ Date: ____________ Start time: ____________ Stop time: ____________ Movements: ____________ Date: ____________  Start time: ____________ Stop time: ____________ Movements: ____________ Date: ____________ Start time: ____________ Stop time: ____________ Movements: ____________ Date: ____________ Start time: ____________ Stop time: ____________ Movements: ____________ Date: ____________ Start time: ____________ Stop time: ____________ Movements: ____________ Date: ____________ Start time: ____________ Stop time: ____________ Movements: ____________ Date: ____________ Start time: ____________ Stop time: ____________ Movements: ____________ Date: ____________ Start time: ____________ Stop time: ____________ Movements: ____________ This information is not intended to replace advice given to you by your health care provider. Make sure you discuss any questions you have with your health care provider. Document Revised: 10/21/2018 Document Reviewed: 10/21/2018 Elsevier Patient Education  2021 Elsevier Inc.  

## 2020-05-08 NOTE — MAU Provider Note (Signed)
History   [id number]   Chief Complaint  Patient presents with  . Decreased Fetal Movement    HPI Cindy Fletcher is a 25 y.o. female  G2P0010 here with report of decreased fetal movement for 2 weeks.  Reports feeling the baby move approximately 0 times this morning prior to coming to MAU. Denies contractions, vaginal bleeding, or leaking of fluid.  Patient's last menstrual period was 08/26/2019.  OB History  Gravida Para Term Preterm AB Living  2       1 0  SAB IAB Ectopic Multiple Live Births  1            # Outcome Date GA Lbr Len/2nd Weight Sex Delivery Anes PTL Lv  2 Current           1 SAB 07/20/19            Past Medical History:  Diagnosis Date  . Anemia   . Asthma     Family History  Problem Relation Age of Onset  . Diabetes Mother   . ADD / ADHD Father   . Asthma Father   . ADD / ADHD Sister   . Hypertension Paternal Aunt   . Diabetes Maternal Grandmother   . Hypertension Maternal Grandmother   . ADD / ADHD Paternal Grandmother   . Hypertension Paternal Grandmother   . ADD / ADHD Paternal Grandfather   . Asthma Paternal Grandfather   . Hypertension Paternal Grandfather     Social History   Socioeconomic History  . Marital status: Single    Spouse name: Not on file  . Number of children: Not on file  . Years of education: Not on file  . Highest education level: Not on file  Occupational History  . Not on file  Tobacco Use  . Smoking status: Never Smoker  . Smokeless tobacco: Never Used  Vaping Use  . Vaping Use: Never used  Substance and Sexual Activity  . Alcohol use: Not Currently  . Drug use: Not Currently  . Sexual activity: Yes    Partners: Male  Other Topics Concern  . Not on file  Social History Narrative  . Not on file   Social Determinants of Health   Financial Resource Strain: Not on file  Food Insecurity: Not on file  Transportation Needs: Not on file  Physical Activity: Not on file  Stress: Not on file  Social  Connections: Not on file    No Known Allergies  No current facility-administered medications on file prior to encounter.   Current Outpatient Medications on File Prior to Encounter  Medication Sig Dispense Refill  . Prenatal Vit-Fe Phos-FA-Omega (VITAFOL GUMMIES) 3.33-0.333-34.8 MG CHEW Chew 3 each by mouth daily. 90 tablet 12  . Blood Pressure Monitoring (BLOOD PRESSURE CUFF) MISC 1 Device by Does not apply route once a week. 1 each 0  . cyclobenzaprine (FLEXERIL) 10 MG tablet Take 1 tablet (10 mg total) by mouth 3 (three) times daily as needed for muscle spasms. (Patient not taking: No sig reported) 30 tablet 0  . Doxylamine-Pyridoxine (DICLEGIS) 10-10 MG TBEC Take 2 tabs at bedtime. If needed, add another tab in the morning. If needed, add another tab in the afternoon, up to 4 tabs/day. (Patient not taking: Reported on 04/24/2020) 100 tablet 5  . Elastic Bandages & Supports (COMFORT FIT MATERNITY SUPP MED) MISC 1 Device by Does not apply route daily. (Patient not taking: No sig reported) 1 each 0     Review of Systems  Constitutional: Negative.   Gastrointestinal: Negative.   Genitourinary: Negative.      Physical Exam   Vitals:   05/08/20 1244 05/08/20 1250  BP:  125/79  Pulse:  96  Resp:  16  Temp:  98.6 F (37 C)  TempSrc:  Oral  SpO2:  99%  Weight: 124.3 kg   Height: 5' 8.5" (1.74 m)     Physical Exam Vitals and nursing note reviewed.  Constitutional:      General: She is not in acute distress.    Appearance: Normal appearance.  HENT:     Head: Normocephalic and atraumatic.  Pulmonary:     Effort: Pulmonary effort is normal. No respiratory distress.  Skin:    General: Skin is warm and dry.  Neurological:     Mental Status: She is alert.  Psychiatric:        Mood and Affect: Mood normal.        Behavior: Behavior normal.    NST:  Baseline: 150 bpm, Variability: Good {> 6 bpm), Accelerations: Reactive and Decelerations: Absent  MAU Course   Procedures  MDM Reactive NST Patient documented 10 movements in 30 minutes while in MAU Has f/u with MFM tomorrow for growth u/s & BPP  Assessment and Plan   1. Decreased fetal movements in third trimester, single or unspecified fetus   2. NST (non-stress test) reactive   3. [redacted] weeks gestation of pregnancy    -reviewed reasons to return to MAU   Judeth Horn, NP 05/08/2020 1:19 PM

## 2020-05-09 ENCOUNTER — Ambulatory Visit: Payer: 59 | Attending: Obstetrics and Gynecology

## 2020-05-09 ENCOUNTER — Encounter: Payer: Self-pay | Admitting: *Deleted

## 2020-05-09 ENCOUNTER — Other Ambulatory Visit: Payer: Self-pay | Admitting: *Deleted

## 2020-05-09 ENCOUNTER — Ambulatory Visit: Payer: 59 | Admitting: *Deleted

## 2020-05-09 DIAGNOSIS — O0990 Supervision of high risk pregnancy, unspecified, unspecified trimester: Secondary | ICD-10-CM | POA: Insufficient documentation

## 2020-05-09 DIAGNOSIS — O2693 Pregnancy related conditions, unspecified, third trimester: Secondary | ICD-10-CM

## 2020-05-09 DIAGNOSIS — U071 COVID-19: Secondary | ICD-10-CM

## 2020-05-09 DIAGNOSIS — O133 Gestational [pregnancy-induced] hypertension without significant proteinuria, third trimester: Secondary | ICD-10-CM

## 2020-05-09 DIAGNOSIS — Z3A36 36 weeks gestation of pregnancy: Secondary | ICD-10-CM

## 2020-05-09 DIAGNOSIS — O24419 Gestational diabetes mellitus in pregnancy, unspecified control: Secondary | ICD-10-CM | POA: Diagnosis not present

## 2020-05-09 DIAGNOSIS — E669 Obesity, unspecified: Secondary | ICD-10-CM

## 2020-05-09 DIAGNOSIS — O98513 Other viral diseases complicating pregnancy, third trimester: Secondary | ICD-10-CM | POA: Diagnosis not present

## 2020-05-09 DIAGNOSIS — O99213 Obesity complicating pregnancy, third trimester: Secondary | ICD-10-CM | POA: Diagnosis not present

## 2020-05-09 DIAGNOSIS — O10913 Unspecified pre-existing hypertension complicating pregnancy, third trimester: Secondary | ICD-10-CM | POA: Diagnosis not present

## 2020-05-09 DIAGNOSIS — Z8616 Personal history of COVID-19: Secondary | ICD-10-CM

## 2020-05-09 LAB — CERVICOVAGINAL ANCILLARY ONLY
Chlamydia: NEGATIVE
Comment: NEGATIVE
Comment: NEGATIVE
Comment: NORMAL
Neisseria Gonorrhea: NEGATIVE
Trichomonas: NEGATIVE

## 2020-05-10 ENCOUNTER — Other Ambulatory Visit: Payer: Self-pay

## 2020-05-10 LAB — STREP GP B NAA: Strep Gp B NAA: POSITIVE — AB

## 2020-05-10 MED ORDER — ONETOUCH ULTRA 2 W/DEVICE KIT: PACK | 0 refills | Status: DC

## 2020-05-10 NOTE — Progress Notes (Signed)
Pt needs CBG testing supplies.  Pt reports using One Touch Ultra 2 Kit sent electronically Matching strips and lancets sent verbally - couldn't locate in EPIC

## 2020-05-11 ENCOUNTER — Encounter: Payer: Self-pay | Admitting: Advanced Practice Midwife

## 2020-05-11 DIAGNOSIS — O99820 Streptococcus B carrier state complicating pregnancy: Secondary | ICD-10-CM | POA: Insufficient documentation

## 2020-05-15 ENCOUNTER — Encounter (HOSPITAL_COMMUNITY): Payer: Self-pay | Admitting: *Deleted

## 2020-05-15 ENCOUNTER — Other Ambulatory Visit: Payer: Self-pay

## 2020-05-15 ENCOUNTER — Telehealth (HOSPITAL_COMMUNITY): Payer: Self-pay | Admitting: *Deleted

## 2020-05-15 ENCOUNTER — Ambulatory Visit (INDEPENDENT_AMBULATORY_CARE_PROVIDER_SITE_OTHER): Payer: BC Managed Care – PPO | Admitting: Advanced Practice Midwife

## 2020-05-15 ENCOUNTER — Other Ambulatory Visit: Payer: Self-pay | Admitting: Advanced Practice Midwife

## 2020-05-15 VITALS — BP 142/85 | HR 118 | Wt 275.0 lb

## 2020-05-15 DIAGNOSIS — O24410 Gestational diabetes mellitus in pregnancy, diet controlled: Secondary | ICD-10-CM

## 2020-05-15 DIAGNOSIS — O10913 Unspecified pre-existing hypertension complicating pregnancy, third trimester: Secondary | ICD-10-CM

## 2020-05-15 DIAGNOSIS — O0993 Supervision of high risk pregnancy, unspecified, third trimester: Secondary | ICD-10-CM

## 2020-05-15 DIAGNOSIS — O24415 Gestational diabetes mellitus in pregnancy, controlled by oral hypoglycemic drugs: Secondary | ICD-10-CM

## 2020-05-15 DIAGNOSIS — Z3A37 37 weeks gestation of pregnancy: Secondary | ICD-10-CM

## 2020-05-15 LAB — POCT UA - GLUCOSE/PROTEIN
Glucose, UA: NEGATIVE
Protein, UA: POSITIVE — AB

## 2020-05-15 MED ORDER — METFORMIN HCL 500 MG PO TABS: 500.0000 mg | ORAL_TABLET | Freq: Two times a day (BID) | ORAL | 0 refills | Status: DC

## 2020-05-15 MED ORDER — LABETALOL HCL 200 MG PO TABS: 200.0000 mg | ORAL_TABLET | Freq: Two times a day (BID) | ORAL | 0 refills | Status: DC

## 2020-05-15 NOTE — Patient Instructions (Signed)
Hypertension During Pregnancy Hypertension is also called high blood pressure. High blood pressure means that the force of the blood moving in your body is high enough to cause problems for you and your baby. Different types of high blood pressure can happen during pregnancy. The types are:  High blood pressure before you got pregnant. This is called chronic hypertension.  This can continue during your pregnancy. Your doctor will want to keep checking your blood pressure. You may need medicine to control your blood pressure while you are pregnant. You will need follow-up visits after you have your baby.  High blood pressure that goes up during pregnancy when it was normal before. This is called gestational hypertension. It will often get better after you have your baby, but your doctor will need to watch your blood pressure to make sure that it is getting better.  You may develop high blood pressure after giving birth. This is called postpartum hypertension. This often occurs within 48 hours after childbirth but may occur up to 6 weeks after giving birth. Very high blood pressure during pregnancy is an emergency that needs treatment right away. How does this affect me? If you have high blood pressure during pregnancy, you have a higher chance of developing high blood pressure:  As you get older.  If you get pregnant again. In some cases, high blood pressure during pregnancy can cause:  Stroke.  Heart attack.  Damage to the kidneys, lungs, or liver.  Preeclampsia.  HELLP syndrome.  Seizures.  Problems with the placenta. How does this affect my baby? Your baby may:  Be born early.  Not weigh as much as he or she should.  Not handle labor well, leading to a C-section. This condition may also result in a baby's death before birth (stillbirth). What are the risks?  Having high blood pressure during a past pregnancy.  Being overweight.  Being age 35 or older.  Being pregnant  for the first time.  Being pregnant with more than one baby.  Becoming pregnant using fertility methods, such as IVF.  Having other problems, such as diabetes or kidney disease. What can I do to lower my risk?  Keep a healthy weight.  Eat a healthy diet.  Follow what your doctor tells you about treating any medical problems that you had before you got pregnant. It is very important to go to all of your doctor visits. Your doctor will check your blood pressure and make sure that your pregnancy is progressing as it should. Treatment should start early if a problem is found.   How is this treated? Treatment for high blood pressure during pregnancy can vary. It depends on the type of high blood pressure you have and how serious it is.  If you were taking medicine for your blood pressure before you got pregnant, talk with your doctor. You may need to change the medicine during pregnancy if it is not safe for your baby.  If your blood pressure goes up during pregnancy, your doctor may order medicine to treat this.  If you are at risk for preeclampsia, your doctor may tell you to take a low-dose aspirin while you are pregnant.  If you have very high blood pressure, you may need to stay in the hospital so you and your baby can be watched closely. You may also need to take medicine to lower your blood pressure.  In some cases, if your condition gets worse, you may need to have your baby early.   Follow these instructions at home: Eating and drinking  Drink enough fluid to keep your pee (urine) pale yellow.  Avoid caffeine.   Lifestyle  Do not smoke or use any products that contain nicotine or tobacco. If you need help quitting, ask your doctor.  Do not use alcohol or drugs.  Avoid stress.  Rest and get plenty of sleep.  Regular exercise can help. Ask your doctor what kinds of exercise are best for you. General instructions  Take over-the-counter and prescription medicines only as  told by your doctor.  Keep all prenatal and follow-up visits. Contact a doctor if:  You have symptoms that your doctor told you to watch for, such as: ? Headaches. ? A feeling like you may vomit (nausea). ? Vomiting. ? Belly (abdominal) pain. ? Feeling dizzy or light-headed. Get help right away if:  You have symptoms of serious problems, such as: ? Very bad belly pain that does not get better with treatment. ? A very bad headache that does not get better. ? Blurry vision. ? Double vision. ? Vomiting that does not get better. ? Sudden, fast weight gain. ? Sudden swelling in your hands, ankles, or face. ? Bleeding from your vagina. ? Blood in your pee. ? Shortness of breath. ? Chest pain. ? Weakness on one side of your body. ? Trouble talking.  Your baby is not moving as much as usual. These symptoms may be an emergency. Get help right away. Call your local emergency services (911 in the [place]  Do not wait to see if the symptoms will go away.  Do not drive yourself to the hospital. Summary  High blood pressure is also called hypertension.  High blood pressure means that the force of the blood moving in your body is high enough to cause problems for you and your baby.  Get help right away if you have symptoms of serious problems due to high blood pressure.  Keep all prenatal and follow-up visits. This information is not intended to replace advice given to you by your health care provider. Make sure you discuss any questions you have with your health care provider. Document Revised: 11/24/2019 Document Reviewed: 11/24/2019 Elsevier Patient Education  2021 Elsevier Inc.  

## 2020-05-15 NOTE — Progress Notes (Signed)
Pt states blood sugars are running a little high. All above 100. + Fetal movement. Pt BP elevated on both arms. Denies HA, visual changes, floaters, or RUQ pain.

## 2020-05-15 NOTE — Progress Notes (Signed)
PRENATAL VISIT NOTE  Subjective:  Cindy Fletcher is a 25 y.o. G2P0010 at [redacted]w[redacted]d being seen today for ongoing prenatal care.  She is currently monitored for the following issues for this high-risk pregnancy and has Miscarriage; Supervision of high risk pregnancy, antepartum; Short cervix in non-gravid uterus; Nausea and vomiting during pregnancy prior to [redacted] weeks gestation; ASCUS of cervix with negative high risk HPV; COVID-19 affecting pregnancy in third trimester; Limited prenatal care in third trimester; Chronic hypertension complicating or reason for care during pregnancy, third trimester; Diet controlled gestational diabetes mellitus (GDM) in third trimester; and GBS (group B Streptococcus carrier), +RV culture, currently pregnant on their problem list.  Patient reports no complaints.  Contractions: Irritability. Vag. Bleeding: None.  Movement: Present. Denies leaking of fluid.   The following portions of the patient's history were reviewed and updated as appropriate: allergies, current medications, past family history, past medical history, past social history, past surgical history and problem list.   Objective:   Vitals:   05/15/20 1021 05/15/20 1023  BP: 140/83 (!) 142/85  Pulse: (!) 118   Weight: 275 lb (124.7 kg)     Fetal Status: Fetal Heart Rate (bpm): 146 Fundal Height: 38 cm Movement: Present  Presentation: Vertex  General:  Alert, oriented and cooperative. Patient is in no acute distress.  Skin: Skin is warm and dry. No rash noted.   Cardiovascular: Normal heart rate noted  Respiratory: Normal respiratory effort, no problems with respiration noted  Abdomen: Soft, gravid, appropriate for gestational age.  Pain/Pressure: Absent     Pelvic: Cervical exam performed in the presence of a chaperone Dilation: 1 Effacement (%): 50 Station: -3  Extremities: Normal range of motion.  Edema: Trace  Mental Status: Normal mood and affect. Normal behavior. Normal judgment and thought  content.   Assessment and Plan:  Pregnancy: G2P0010 at [redacted]w[redacted]d 1. [redacted] weeks gestation of pregnancy   2. Supervision of high risk pregnancy in third trimester --Anticipatory guidance about next visits/weeks of pregnancy given. --IOL scheduled in 3 days, see below --signs of labor/reasons to seek care reviewed  3. Chronic hypertension complicating or reason for care during pregnancy, third trimester --Pt with CHTN, with HTN intermittent prepregnancy and HTN noted at 24 and 32 weeks and today.  No s/sx of PEC. --Pt reports BP 150s/90s and 140s/90s over the weekend --Consult Dr Adrian Blackwater. See below. --PEC labs today  4. Diet controlled gestational diabetes mellitus (GDM) in third trimester --Pt with late dx of GDM, then delay due to insurance in getting a meter for patient. She has been taking fasting glucose in the morning x 4 days and they are 105-127.  She reports she did not take PP because she assumed they would be high also.    --Consult Dr Adrian Blackwater.  Pt with CHTN not on medications with borderline BPs today and GDM with new use of meter this week with elevated fasting glucose. --Per Dr Adrian Blackwater, start medications for [facility] and GDM and induce labor at 38 weeks, on Friday 05/19/19.   --Pt to keep BPP appt tomorrow with MFM. --PEC precautions/reasons to seek care reviewed  --Metformin 500 mg BID and Labetalol 200 mg BID to start today --Orders placed for induction   Term labor symptoms and general obstetric precautions including but not limited to vaginal bleeding, contractions, leaking of fluid and fetal movement were reviewed in detail with the patient. Please refer to After Visit Summary for other counseling recommendations.   No follow-ups on file.  Future Appointments  Date Time Provider Department Center  05/16/2020  7:30 AM [facility] NURSE [facility]  05/16/2020  7:45 AM [facility] US6 [facility]  05/16/2020  3:15 PM NDM-[facility] GDM CLASS NDM-NMCH NDM  05/23/2020  7:30 AM [facility] NURSE  [facility]  05/23/2020  7:45 AM [facility] US4 [facility]  05/25/2020  6:30 AM MC-LD SCHED ROOM MC-INDC None    Sharen Counter, CNM

## 2020-05-15 NOTE — Telephone Encounter (Signed)
Preadmission screen  

## 2020-05-16 ENCOUNTER — Ambulatory Visit: Payer: 59 | Admitting: *Deleted

## 2020-05-16 ENCOUNTER — Ambulatory Visit (HOSPITAL_BASED_OUTPATIENT_CLINIC_OR_DEPARTMENT_OTHER): Payer: 59

## 2020-05-16 ENCOUNTER — Encounter: Payer: Self-pay | Admitting: *Deleted

## 2020-05-16 DIAGNOSIS — O10913 Unspecified pre-existing hypertension complicating pregnancy, third trimester: Secondary | ICD-10-CM | POA: Insufficient documentation

## 2020-05-16 DIAGNOSIS — U071 COVID-19: Secondary | ICD-10-CM | POA: Insufficient documentation

## 2020-05-16 DIAGNOSIS — O99213 Obesity complicating pregnancy, third trimester: Secondary | ICD-10-CM | POA: Diagnosis not present

## 2020-05-16 DIAGNOSIS — O24425 Gestational diabetes mellitus in childbirth, controlled by oral hypoglycemic drugs: Secondary | ICD-10-CM | POA: Diagnosis not present

## 2020-05-16 DIAGNOSIS — O24429 Gestational diabetes mellitus in childbirth, unspecified control: Secondary | ICD-10-CM | POA: Diagnosis not present

## 2020-05-16 DIAGNOSIS — O99820 Streptococcus B carrier state complicating pregnancy: Secondary | ICD-10-CM | POA: Insufficient documentation

## 2020-05-16 DIAGNOSIS — O133 Gestational [pregnancy-induced] hypertension without significant proteinuria, third trimester: Secondary | ICD-10-CM

## 2020-05-16 DIAGNOSIS — O0990 Supervision of high risk pregnancy, unspecified, unspecified trimester: Secondary | ICD-10-CM

## 2020-05-16 DIAGNOSIS — O9952 Diseases of the respiratory system complicating childbirth: Secondary | ICD-10-CM | POA: Diagnosis not present

## 2020-05-16 DIAGNOSIS — Z8616 Personal history of COVID-19: Secondary | ICD-10-CM

## 2020-05-16 DIAGNOSIS — E669 Obesity, unspecified: Secondary | ICD-10-CM | POA: Diagnosis not present

## 2020-05-16 DIAGNOSIS — O1002 Pre-existing essential hypertension complicating childbirth: Secondary | ICD-10-CM | POA: Diagnosis not present

## 2020-05-16 DIAGNOSIS — O98513 Other viral diseases complicating pregnancy, third trimester: Secondary | ICD-10-CM | POA: Insufficient documentation

## 2020-05-16 DIAGNOSIS — Z3A37 37 weeks gestation of pregnancy: Secondary | ICD-10-CM

## 2020-05-16 DIAGNOSIS — O99824 Streptococcus B carrier state complicating childbirth: Secondary | ICD-10-CM | POA: Diagnosis not present

## 2020-05-16 DIAGNOSIS — O1092 Unspecified pre-existing hypertension complicating childbirth: Secondary | ICD-10-CM | POA: Diagnosis not present

## 2020-05-16 DIAGNOSIS — Z3A38 38 weeks gestation of pregnancy: Secondary | ICD-10-CM | POA: Diagnosis not present

## 2020-05-16 DIAGNOSIS — Z79899 Other long term (current) drug therapy: Secondary | ICD-10-CM | POA: Diagnosis not present

## 2020-05-16 DIAGNOSIS — O99830 Other infection carrier state complicating pregnancy: Secondary | ICD-10-CM | POA: Diagnosis not present

## 2020-05-16 DIAGNOSIS — O24419 Gestational diabetes mellitus in pregnancy, unspecified control: Secondary | ICD-10-CM

## 2020-05-16 DIAGNOSIS — O24424 Gestational diabetes mellitus in childbirth, insulin controlled: Secondary | ICD-10-CM | POA: Diagnosis not present

## 2020-05-16 DIAGNOSIS — J45909 Unspecified asthma, uncomplicated: Secondary | ICD-10-CM | POA: Diagnosis not present

## 2020-05-16 LAB — COMPREHENSIVE METABOLIC PANEL
ALT: 10 IU/L (ref 0–32)
AST: 19 IU/L (ref 0–40)
Albumin/Globulin Ratio: 1.3 (ref 1.2–2.2)
Albumin: 3.6 g/dL — ABNORMAL LOW (ref 3.9–5.0)
Alkaline Phosphatase: 151 IU/L — ABNORMAL HIGH (ref 44–121)
BUN/Creatinine Ratio: 10 (ref 9–23)
BUN: 5 mg/dL — ABNORMAL LOW (ref 6–20)
Bilirubin Total: 0.7 mg/dL (ref 0.0–1.2)
CO2: 20 mmol/L (ref 20–29)
Calcium: 9.4 mg/dL (ref 8.7–10.2)
Chloride: 102 mmol/L (ref 96–106)
Creatinine, Ser: 0.49 mg/dL — ABNORMAL LOW (ref 0.57–1.00)
Globulin, Total: 2.7 g/dL (ref 1.5–4.5)
Glucose: 102 mg/dL — ABNORMAL HIGH (ref 65–99)
Potassium: 3.9 mmol/L (ref 3.5–5.2)
Sodium: 137 mmol/L (ref 134–144)
Total Protein: 6.3 g/dL (ref 6.0–8.5)
eGFR: 135 mL/min/{1.73_m2} (ref >59)

## 2020-05-16 LAB — CBC
Hematocrit: 31.7 % — ABNORMAL LOW (ref 34.0–46.6)
Hemoglobin: 10.3 g/dL — ABNORMAL LOW (ref 11.1–15.9)
MCH: 27.2 pg (ref 26.6–33.0)
MCHC: 32.5 g/dL (ref 31.5–35.7)
MCV: 84 fL (ref 79–97)
Platelets: 253 10*3/uL (ref 150–450)
RBC: 3.79 x10E6/uL (ref 3.77–5.28)
RDW: 13.7 % (ref 11.7–15.4)
WBC: 7.8 10*3/uL (ref 3.4–10.8)

## 2020-05-16 LAB — PROTEIN / CREATININE RATIO, URINE
Creatinine, Urine: 74.7 mg/dL
Protein, Ur: 11.1 mg/dL
Protein/Creat Ratio: 149 mg/g creat (ref 0–200)

## 2020-05-18 ENCOUNTER — Inpatient Hospital Stay (HOSPITAL_COMMUNITY): Payer: 59

## 2020-05-18 ENCOUNTER — Other Ambulatory Visit: Payer: Self-pay

## 2020-05-18 ENCOUNTER — Encounter (HOSPITAL_COMMUNITY): Payer: Self-pay | Admitting: Family Medicine

## 2020-05-18 ENCOUNTER — Inpatient Hospital Stay (HOSPITAL_COMMUNITY)
Admission: AD | Admit: 2020-05-18 | Discharge: 2020-05-22 | DRG: 807 | Disposition: A | Discharge status: Home / Self Care | Payer: 59 | Attending: Obstetrics and Gynecology | Admitting: Obstetrics and Gynecology

## 2020-05-18 DIAGNOSIS — O99824 Streptococcus B carrier state complicating childbirth: Secondary | ICD-10-CM | POA: Diagnosis present

## 2020-05-18 DIAGNOSIS — Z79899 Other long term (current) drug therapy: Secondary | ICD-10-CM | POA: Diagnosis not present

## 2020-05-18 DIAGNOSIS — U071 COVID-19: Secondary | ICD-10-CM | POA: Diagnosis present

## 2020-05-18 DIAGNOSIS — Z3A38 38 weeks gestation of pregnancy: Secondary | ICD-10-CM | POA: Diagnosis not present

## 2020-05-18 DIAGNOSIS — O0933 Supervision of pregnancy with insufficient antenatal care, third trimester: Secondary | ICD-10-CM

## 2020-05-18 DIAGNOSIS — O1002 Pre-existing essential hypertension complicating childbirth: Secondary | ICD-10-CM | POA: Diagnosis present

## 2020-05-18 DIAGNOSIS — O24424 Gestational diabetes mellitus in childbirth, insulin controlled: Secondary | ICD-10-CM | POA: Diagnosis not present

## 2020-05-18 DIAGNOSIS — O24420 Gestational diabetes mellitus in childbirth, diet controlled: Secondary | ICD-10-CM | POA: Diagnosis present

## 2020-05-18 DIAGNOSIS — O24425 Gestational diabetes mellitus in childbirth, controlled by oral hypoglycemic drugs: Secondary | ICD-10-CM | POA: Diagnosis present

## 2020-05-18 DIAGNOSIS — Z8616 Personal history of COVID-19: Secondary | ICD-10-CM | POA: Diagnosis not present

## 2020-05-18 DIAGNOSIS — O10913 Unspecified pre-existing hypertension complicating pregnancy, third trimester: Secondary | ICD-10-CM | POA: Diagnosis present

## 2020-05-18 DIAGNOSIS — O98513 Other viral diseases complicating pregnancy, third trimester: Secondary | ICD-10-CM | POA: Diagnosis present

## 2020-05-18 DIAGNOSIS — O0990 Supervision of high risk pregnancy, unspecified, unspecified trimester: Secondary | ICD-10-CM

## 2020-05-18 DIAGNOSIS — O1092 Unspecified pre-existing hypertension complicating childbirth: Secondary | ICD-10-CM | POA: Diagnosis not present

## 2020-05-18 HISTORY — DX: Gestational diabetes mellitus in pregnancy, unspecified control: O24.419

## 2020-05-18 HISTORY — DX: Essential (primary) hypertension: I10

## 2020-05-18 LAB — CBC
HCT: 30.7 % — ABNORMAL LOW (ref 36.0–46.0)
Hemoglobin: 10.3 g/dL — ABNORMAL LOW (ref 12.0–15.0)
MCH: 28.2 pg (ref 26.0–34.0)
MCHC: 33.6 g/dL (ref 30.0–36.0)
MCV: 84.1 fL (ref 80.0–100.0)
Platelets: 271 10*3/uL (ref 150–400)
RBC: 3.65 MIL/uL — ABNORMAL LOW (ref 3.87–5.11)
RDW: 13.8 % (ref 11.5–15.5)
WBC: 7.6 10*3/uL (ref 4.0–10.5)
nRBC: 0 % (ref 0.0–0.2)

## 2020-05-18 LAB — COMPREHENSIVE METABOLIC PANEL
ALT: 15 U/L (ref 0–44)
AST: 24 U/L (ref 15–41)
Albumin: 2.8 g/dL — ABNORMAL LOW (ref 3.5–5.0)
Alkaline Phosphatase: 131 U/L — ABNORMAL HIGH (ref 38–126)
Anion gap: 8 (ref 5–15)
BUN: 5 mg/dL — ABNORMAL LOW (ref 6–20)
CO2: 22 mmol/L (ref 22–32)
Calcium: 8.9 mg/dL (ref 8.9–10.3)
Chloride: 106 mmol/L (ref 98–111)
Creatinine, Ser: 0.51 mg/dL (ref 0.44–1.00)
GFR, Estimated: >60 mL/min (ref >60)
Glucose, Bld: 88 mg/dL (ref 70–99)
Potassium: 4.1 mmol/L (ref 3.5–5.1)
Sodium: 136 mmol/L (ref 135–145)
Total Bilirubin: 0.8 mg/dL (ref 0.3–1.2)
Total Protein: 6.2 g/dL — ABNORMAL LOW (ref 6.5–8.1)

## 2020-05-18 LAB — GLUCOSE, CAPILLARY
Glucose-Capillary: 115 mg/dL — ABNORMAL HIGH (ref 70–99)
Glucose-Capillary: 119 mg/dL — ABNORMAL HIGH (ref 70–99)

## 2020-05-18 LAB — TYPE AND SCREEN
ABO/RH(D): A POS
Antibody Screen: NEGATIVE

## 2020-05-18 LAB — PROTEIN / CREATININE RATIO, URINE
Creatinine, Urine: 72.26 mg/dL
Protein Creatinine Ratio: 0.14 mg/mg{Cre} (ref 0.00–0.15)
Total Protein, Urine: 10 mg/dL

## 2020-05-18 MED ORDER — LABETALOL HCL 200 MG PO TABS
200.0000 mg | ORAL_TABLET | Freq: Two times a day (BID) | ORAL | Status: DC
  Administered 2020-05-18 – 2020-05-20 (×4): 200 mg via ORAL
  Filled 2020-05-18 (×4): qty 1

## 2020-05-18 MED ORDER — LACTATED RINGERS IV SOLN: INTRAVENOUS | Status: DC

## 2020-05-18 MED ORDER — OXYCODONE-ACETAMINOPHEN 5-325 MG PO TABS: 1.0000 | ORAL_TABLET | ORAL | Status: DC | PRN

## 2020-05-18 MED ORDER — PENICILLIN G POT IN DEXTROSE 60000 UNIT/ML IV SOLN
3.0000 10*6.[IU] | INTRAVENOUS | Status: DC
  Administered 2020-05-18 – 2020-05-20 (×11): 3 10*6.[IU] via INTRAVENOUS
  Filled 2020-05-18 (×11): qty 50

## 2020-05-18 MED ORDER — FENTANYL CITRATE (PF) 100 MCG/2ML IJ SOLN
100.0000 ug | INTRAMUSCULAR | Status: DC | PRN
Start: 2020-05-18 — End: 2020-05-20
  Administered 2020-05-18 – 2020-05-19 (×6): 100 ug via INTRAVENOUS
  Filled 2020-05-18 (×7): qty 2

## 2020-05-18 MED ORDER — OXYCODONE-ACETAMINOPHEN 5-325 MG PO TABS
2.0000 | ORAL_TABLET | ORAL | Status: DC | PRN
Start: 2020-05-18 — End: 2020-05-20

## 2020-05-18 MED ORDER — MISOPROSTOL 50MCG HALF TABLET
50.0000 ug | ORAL_TABLET | ORAL | Status: DC | PRN
  Administered 2020-05-18 – 2020-05-19 (×5): 50 ug via ORAL
  Filled 2020-05-18 (×5): qty 1

## 2020-05-18 MED ORDER — ONDANSETRON HCL 4 MG/2ML IJ SOLN
4.0000 mg | Freq: Four times a day (QID) | INTRAMUSCULAR | Status: DC | PRN
  Administered 2020-05-19 – 2020-05-20 (×2): 4 mg via INTRAVENOUS
  Filled 2020-05-18 (×2): qty 2

## 2020-05-18 MED ORDER — OXYTOCIN-SODIUM CHLORIDE 30-0.9 UT/500ML-% IV SOLN
2.5000 [IU]/h | INTRAVENOUS | Status: DC
  Administered 2020-05-20: 2.5 [IU]/h via INTRAVENOUS
  Filled 2020-05-18 (×2): qty 500

## 2020-05-18 MED ORDER — TERBUTALINE SULFATE 1 MG/ML IJ SOLN: 0.2500 mg | Freq: Once | INTRAMUSCULAR | Status: DC | PRN

## 2020-05-18 MED ORDER — LACTATED RINGERS IV SOLN
500.0000 mL | INTRAVENOUS | Status: DC | PRN
  Administered 2020-05-20: 500 mL via INTRAVENOUS

## 2020-05-18 MED ORDER — OXYTOCIN BOLUS FROM INFUSION
333.0000 mL | Freq: Once | INTRAVENOUS | Status: AC
  Administered 2020-05-20: 333 mL via INTRAVENOUS

## 2020-05-18 MED ORDER — LIDOCAINE HCL (PF) 1 % IJ SOLN: 30.0000 mL | INTRAMUSCULAR | Status: DC | PRN

## 2020-05-18 MED ORDER — SODIUM CHLORIDE 0.9 % IV SOLN
5.0000 10*6.[IU] | Freq: Once | INTRAVENOUS | Status: AC
  Administered 2020-05-18: 5 10*6.[IU] via INTRAVENOUS
  Filled 2020-05-18: qty 5

## 2020-05-18 MED ORDER — ACETAMINOPHEN 325 MG PO TABS: 650.0000 mg | ORAL_TABLET | ORAL | Status: DC | PRN

## 2020-05-18 MED ORDER — SOD CITRATE-CITRIC ACID 500-334 MG/5ML PO SOLN: 30.0000 mL | ORAL | Status: DC | PRN

## 2020-05-18 NOTE — Progress Notes (Signed)
Cindy Fletcher is a 25 y.o. G2P0010 at 101w0d   Subjective: Resting in bed, support person at bedside. Coping with pain via distraction. Requesting additional dose of IV pain medicine.  Objective: BP (!) 155/90    Pulse 86    Temp 99.1 F (37.3 C) (Oral)    Resp 17    Ht 5' 8.5" (1.74 m)    Wt 124.8 kg    LMP 08/26/2019    BMI 41.24 kg/m  No intake/output data recorded. No intake/output data recorded.  FHT:  FHR: 140 bpm, variability: moderate,  accelerations:  Present,  decelerations:  Absent UC:   irregular, every 2-4 minutes SVE:   Dilation: 1 Effacement (%): 60 Station: -2 Exam by:: Goswick, MD  Labs: Lab Results  Component Value Date   WBC 7.6 05/18/2020   HGB 10.3 (L) 05/18/2020   HCT 30.7 (L) 05/18/2020   MCV 84.1 05/18/2020   PLT 271 05/18/2020    Assessment / Plan: --25 y.o. G2P0010 at [redacted]w[redacted]d  --Cat I tracing  Labor --S/p Cytotec x 2, foley balloon in place --GBS POS, second dose administered at 1642 -- Plan for snack, quick shower when FB dislodged, prior to starting Pitocin -- IV Fentanyl previously ordered, available PRN  CHTN --150s systolic, will give Labetalol 283 mg -- No severe range BPs, no severe symptoms --Admission P:Cr 0.14 at 1513  A1GDM --Well controlled  --CBGs < 120 --q 4 hour CBGs until active labor, then q 2  Anticipated MOD:  NSVD  Cindy Fletcher, CNM 05/18/2020, 10:38 PM

## 2020-05-18 NOTE — H&P (Addendum)
OBSTETRIC ADMISSION HISTORY AND PHYSICAL  Cindy Fletcher is a 25 y.o. female G2P0010 at 23w0dby L/8 who [facility]. Pregnancy also complicated by A2O9GEXShe reports +FMs, No LOF, no VB, no blurry vision, headaches, nausea, vomiting, fever, or peripheral edema, and RUQ pain. She plans on breast feeding. She requests pills for birth control.   [facility]  Dating: By L/8 --->  Estimated Date of Delivery: 06/01/20  Sono:  _0 , CWD, normal anatomy, cephalic presentation, undetermined lie, 3370g, 68% EFW  Prenatal History/Complications:  - Nausea and vomiting during pregnancy prior to [redacted] weeks gestation - ASCUS of cervix with negative high risk HPV - COVID-19 affecting pregnancy in third trimester - Limited PNC in third trimester - cHTN (labetalol 20079mID in pregnancy) - A2GDM (metformin 500m52mD) - GBS carrier  Past Medical History: Past Medical History:  Diagnosis Date  . Anemia   . Asthma   . Gestational diabetes   . Hypertension     Past Surgical History: Past Surgical History:  Procedure Laterality Date  . NO PAST SURGERIES      Obstetrical History: OB History    Gravida  2   Para      Term      Preterm      AB  1   Living  0     SAB  1   IAB      Ectopic      Multiple      Live Births              Social History Social History   Socioeconomic History  . Marital status: Single    Spouse name: Not on file  . Number of children: Not on file  . Years of education: Not on file  . Highest education level: Not on file  Occupational History  . Not on file  Tobacco Use  . Smoking status: Never Smoker  . Smokeless tobacco: Never Used  Vaping Use  . Vaping Use: Never used  Substance and Sexual Activity  . Alcohol use: Not Currently  . Drug use: Not Currently    Types: Marijuana  . Sexual activity: Yes    Partners: Male  Other Topics Concern  . Not on file   Social History Narrative  . Not on file   Social Determinants of Health   Financial Resource Strain: Not on file  Food Insecurity: Not on file  Transportation Needs: Not on file  Physical Activity: Not on file  Stress: Not on file  Social Connections: Not on file    Family History: Family History  Problem Relation Age of Onset  . Diabetes Mother   . ADD / ADHD Father   . Asthma Father   . ADD / ADHD Sister   . Hypertension Paternal Aunt   . Diabetes Maternal Grandmother   . Hypertension Maternal Grandmother   . ADD / ADHD Paternal Grandmother   . Hypertension Paternal Grandmother   . ADD / ADHD Paternal Grandfather   . Asthma Paternal Grandfather   . Hypertension Paternal Grandfather     Allergies: No Known Allergies  Medications Prior to Admission  Medication Sig Dispense Refill Last Dose  . Blood Glucose Monitoring Suppl (ONE TOUCH ULTRA 2) w/Device KIT Check blood sugars by percutaneous route four times daily 1 kit 0 Past Week at Unknown time  . Blood Pressure Monitoring (BLOOD PRESSURE CUFF) MISC 1  Device by Does not apply route once a week. 1 each 0 Past Week at Unknown time  . labetalol (NORMODYNE) 200 MG tablet Take 1 tablet (200 mg total) by mouth 2 (two) times daily. 20 tablet 0 05/17/2020 at Unknown time  . metFORMIN (GLUCOPHAGE) 500 MG tablet Take 1 tablet (500 mg total) by mouth 2 (two) times daily with a meal. 20 tablet 0 05/17/2020 at Unknown time  . OneTouch Delica Lancets 40X MISC 4 (four) times daily.   Past Week at Unknown time  . ONETOUCH ULTRA test strip 4 (four) times daily.   Past Week at Unknown time  . Prenatal Vit-Fe Phos-FA-Omega (VITAFOL GUMMIES) 3.33-0.333-34.8 MG CHEW Chew 3 each by mouth daily. 90 tablet 12 Past Week at Unknown time     Review of Systems   All systems reviewed and negative except as stated in HPI  Blood pressure (!) 142/83, pulse (!) 105, temperature 98.8 F (37.1 C), temperature source Oral, resp. rate 16, height 5' 8.5"  (1.74 m), weight 124.8 kg, last menstrual period 08/26/2019, unknown if currently breastfeeding. General appearance: alert, cooperative and no distress Lungs: normal WOB Heart: regular rate Abdomen: soft, non-tender Extremities: no sign of DVT Presentation: cephalic Fetal monitoring: Baseline: 135 bpm, Variability: Good {> 6 bpm) and Accelerations: Reactive Uterine activity: Frequency: 10 min Dilation: 1 Effacement (%): Thick Station: Ballotable Exam by:: Keith Rake RN  Prenatal labs: ABO, Rh: --/--/PENDING (03/04 1234) Antibody: PENDING (03/04 1234) Rubella: 9.89 (08/23 1437) RPR: Non Reactive (02/08 1051)  HBsAg: Negative (08/23 1437)  HIV: Non Reactive (02/08 1051)  GBS: Positive/-- (02/22 1149)  2 hr Glucola 106/126/83  Genetic screening: NIPS: Low risk female AFP Anatomy US: wnl  Prenatal Transfer Tool  Maternal Diabetes: Yes:  Diabetes Type:  Insulin/Medication controlled Genetic Screening: Normal Maternal Ultrasounds/Referrals: Normal Fetal Ultrasounds or other Referrals:  None, Referral to MFM (cHTN, A2GDM) Maternal Substance Abuse:  No Significant Maternal Medications:  Labetalol, metformin Significant Maternal Lab Results: Group B Strep positive  Results for orders placed or performed during the hospital encounter of 05/18/20 (from the past 24 hour(s))  Type and screen   Collection Time: 05/18/20 12:34 PM  Result Value Ref Range   ABO/RH(D) PENDING    Antibody Screen PENDING    Sample Expiration      05/21/2020,2359 Performed at [facility], [place] [address] [place], [place] [address]     Patient Active Problem List   Diagnosis Date Noted  . Gestational diabetes mellitus (GDM) in childbirth, diet controlled 05/18/2020  . GBS (group B Streptococcus carrier), +RV culture, currently pregnant 05/11/2020  . GDM, class A2 05/01/2020  . Limited prenatal care in third trimester 04/09/2020  . Chronic hypertension complicating or reason for care during  pregnancy, third trimester 04/09/2020  . COVID-19 affecting pregnancy in third trimester 03/14/2020  . ASCUS of cervix with negative high risk HPV 11/14/2019  . Short cervix in non-gravid uterus 11/08/2019  . Nausea and vomiting during pregnancy prior to [redacted] weeks gestation 11/08/2019  . Supervision of high risk pregnancy, antepartum 11/07/2019  . Miscarriage 08/01/2019    Assessment/Plan:  Domonic Hiscox is a 25 y.o. G2P0010 at 3w0dby L/8 who [facility] also [facility] by AD9MEQ   #Labor: Given initial cervical exam, will initiate cytotec and plan for FB placement. #Pain: TBD per pt request; considering epidural #FWB: Category 1 strip #ID: GBS positive; IV penicillin initiated on arrival #MOF: Breast #MOC: pills #cHTN: Labetalol 200  mg BID in pregnancy. Mild range blood pressure on admission. Asymptomatic. Will f/u preeclampsia labs. #A2GDM: metformin 500 BID in pregnancy. Will check BG levels q4 hours in latent labor, then q2hr in active labor. #Limited PNC: Consult SW in postpartum period  Rennis Chris, Medical Student  05/18/2020, 2:39 PM  Attestation of Supervision of Student:  I confirm that I have verified the information documented in the medical student's note and that I have also personally reperformed the history, physical exam and all medical decision making activities.  I have verified that all services and findings are accurately documented in this student's note; and I agree with management and plan as outlined in the documentation. I have also made any necessary editorial changes.  Randa Ngo, [place] for [facility], [address] 05/18/2020 6:50 PM

## 2020-05-19 LAB — GLUCOSE, CAPILLARY
Glucose-Capillary: 130 mg/dL — ABNORMAL HIGH (ref 70–99)
Glucose-Capillary: 98 mg/dL (ref 70–99)
Glucose-Capillary: 107 mg/dL — ABNORMAL HIGH (ref 70–99)
Glucose-Capillary: 104 mg/dL — ABNORMAL HIGH (ref 70–99)
Glucose-Capillary: 106 mg/dL — ABNORMAL HIGH (ref 70–99)
Glucose-Capillary: 108 mg/dL — ABNORMAL HIGH (ref 70–99)

## 2020-05-19 LAB — CBC
HCT: 32.8 % — ABNORMAL LOW (ref 36.0–46.0)
HCT: 31.9 % — ABNORMAL LOW (ref 36.0–46.0)
Hemoglobin: 10.3 g/dL — ABNORMAL LOW (ref 12.0–15.0)
Hemoglobin: 10.2 g/dL — ABNORMAL LOW (ref 12.0–15.0)
MCH: 26.8 pg (ref 26.0–34.0)
MCH: 27.3 pg (ref 26.0–34.0)
MCHC: 31.4 g/dL (ref 30.0–36.0)
MCHC: 32 g/dL (ref 30.0–36.0)
MCV: 85.2 fL (ref 80.0–100.0)
MCV: 85.3 fL (ref 80.0–100.0)
Platelets: 258 10*3/uL (ref 150–400)
Platelets: 245 10*3/uL (ref 150–400)
RBC: 3.85 MIL/uL — ABNORMAL LOW (ref 3.87–5.11)
RBC: 3.74 MIL/uL — ABNORMAL LOW (ref 3.87–5.11)
RDW: 13.9 % (ref 11.5–15.5)
RDW: 14.1 % (ref 11.5–15.5)
WBC: 12.9 10*3/uL — ABNORMAL HIGH (ref 4.0–10.5)
WBC: 14.7 10*3/uL — ABNORMAL HIGH (ref 4.0–10.5)
nRBC: 0 % (ref 0.0–0.2)
nRBC: 0 % (ref 0.0–0.2)

## 2020-05-19 LAB — RPR: RPR Ser Ql: NONREACTIVE

## 2020-05-19 MED ORDER — EPHEDRINE 5 MG/ML INJ: 10.0000 mg | INTRAVENOUS | Status: DC | PRN

## 2020-05-19 MED ORDER — PHENYLEPHRINE 40 MCG/ML (10ML) SYRINGE FOR IV PUSH (FOR BLOOD PRESSURE SUPPORT)
80.0000 ug | PREFILLED_SYRINGE | INTRAVENOUS | Status: DC | PRN
  Filled 2020-05-19: qty 10

## 2020-05-19 MED ORDER — TERBUTALINE SULFATE 1 MG/ML IJ SOLN: 0.2500 mg | Freq: Once | INTRAMUSCULAR | Status: DC | PRN

## 2020-05-19 MED ORDER — DIPHENHYDRAMINE HCL 50 MG/ML IJ SOLN: 12.5000 mg | INTRAMUSCULAR | Status: DC | PRN

## 2020-05-19 MED ORDER — FENTANYL-BUPIVACAINE-NACL 0.5-0.125-0.9 MG/250ML-% EP SOLN
12.0000 mL/h | EPIDURAL | Status: DC | PRN
Start: 2020-05-19 — End: 2020-05-20
  Administered 2020-05-20: 12 mL/h via EPIDURAL
  Filled 2020-05-19: qty 250

## 2020-05-19 MED ORDER — OXYTOCIN-SODIUM CHLORIDE 30-0.9 UT/500ML-% IV SOLN
1.0000 m[IU]/min | INTRAVENOUS | Status: DC
  Administered 2020-05-19: 2 m[IU]/min via INTRAVENOUS

## 2020-05-19 MED ORDER — LACTATED RINGERS IV SOLN
500.0000 mL | Freq: Once | INTRAVENOUS | Status: AC
  Administered 2020-05-19: 500 mL via INTRAVENOUS

## 2020-05-19 MED ORDER — PHENYLEPHRINE 40 MCG/ML (10ML) SYRINGE FOR IV PUSH (FOR BLOOD PRESSURE SUPPORT)
80.0000 ug | PREFILLED_SYRINGE | INTRAVENOUS | Status: DC | PRN
  Administered 2020-05-20: 80 ug via INTRAVENOUS

## 2020-05-19 NOTE — Progress Notes (Signed)
Cindy Fletcher is a 25 y.o. G2P0010 at [redacted]w[redacted]d  Subjective: Feeling more comfortable s/p foley bulb. Patient is unaware of contractions, pain score 0/10.  Objective: BP (!) 153/64   Pulse 73   Temp 98.2 F (36.8 C) (Oral)   Resp 17   Ht 5' 8.5" (1.74 m)   Wt 124.8 kg   LMP 08/26/2019   BMI 41.24 kg/m  No intake/output data recorded. No intake/output data recorded.  FHT:  FHR: 140 bpm, variability: moderate,  accelerations:  Present,  decelerations:  Absent UC:   irregular, every 2-4 minutes SVE:   Dilation: 3.5 Effacement (%): 50 Station: -2 Exam by:: Makaia Bloodgood, CNM  Labs: Lab Results  Component Value Date   WBC 12.9 (H) 05/19/2020   HGB 10.3 (L) 05/19/2020   HCT 32.8 (L) 05/19/2020   MCV 85.2 05/19/2020   PLT 258 05/19/2020    Assessment / Plan: --Cat I tracing --1 CBG > 120, partner eating cookout. CNM at bedside to discuss Endotool if patient has additional elevated CBG --CHTN, stable, no severe range or severe symptoms --GBS POS: Adequate --Anticipate NSVD  [name], CNM 05/19/2020, 0130

## 2020-05-19 NOTE — Progress Notes (Signed)
Cindy Fletcher is a 25 y.o. G2P0010 at [redacted]w[redacted]d admitted for IOL due to [place] and A2GDM.  Subjective: Patient feeling ctx, but denies significant pain pressure. Patient has no other concerns or complaints at this time.  Objective: BP (!) 130/49   Pulse 100   Temp 97.6 F (36.4 C) (Oral)   Resp 18   Ht 5' 8.5" (1.74 m)   Wt 124.8 kg   LMP 08/26/2019   SpO2 98%   BMI 41.24 kg/m  No intake/output data recorded. No intake/output data recorded.  FHT:  FHR: 135 bpm, variability: moderate,  accelerations:  Present,  decelerations:  Absent UC: q2-5 mins SVE:   Dilation: 4 Effacement (%): 60 Station: -2 Exam by:: Precious Gilding, CNM  Labs: Lab Results  Component Value Date   WBC 12.9 (H) 05/19/2020   HGB 10.3 (L) 05/19/2020   HCT 32.8 (L) 05/19/2020   MCV 85.2 05/19/2020   PLT 258 05/19/2020    Assessment / Plan: Cindy Fletcher is a 25 y.o. G2P0010 at [redacted]w[redacted]d admitted for IOL due to cHTN and A2GDM.  #IOL: s/p cytotec x3 and FB. Currently on pit 18 mu/min. Has been on pit ~9 hrs. Cervix unchanged from previous two exams, 4/60/-2. Will d/c pit and give additional buccal cytotec. --Cat I tracing --GBS +: adequately treated #cHTN: mild elevation, no severe range BPs or symptoms. Continue labetalol 200 BID #A2GDM: BG 98 and 107 this morning. Continue CBGs q4 until active labor Anticipate NSVD   Maury Dus, MD PGY-1 [facility] 05/19/2020 10:34 AM

## 2020-05-19 NOTE — Progress Notes (Signed)
Cindy Fletcher is a 25 y.o. G2P0010 at [redacted]w[redacted]d admitted for IOL due to [place] and A2GDM.  Subjective: Patient just showered, feels contractions but can talk through them.   Objective: BP (!) 125/55   Pulse 82   Temp 99.3 F (37.4 C) (Oral)   Resp 16   Ht 5' 8.5" (1.74 m)   Wt 124.8 kg   LMP 08/26/2019   SpO2 97%   BMI 41.24 kg/m  No intake/output data recorded. No intake/output data recorded.  FHT:  FHR: 150 bpm, variability: moderate,  accelerations:  Present,  decelerations:  Absent UC: q4-5 mins SVE:   Dilation: 4 Effacement (%): 70,80 Station: -2 Exam by:: Kittie Plater, RNC  Labs: Lab Results  Component Value Date   WBC 12.9 (H) 05/19/2020   HGB 10.3 (L) 05/19/2020   HCT 32.8 (L) 05/19/2020   MCV 85.2 05/19/2020   PLT 258 05/19/2020    Assessment / Plan: Cindy Fletcher is a 25 y.o. G2P0010 at [redacted]w[redacted]d admitted for IOL due to cHTN and A2GDM.  #IOL: s/p cytotec x3, FB, and pitocin x9 hrs. Pit was d/c'd @1000  this morning. Cytotec x2. Restarted pit at 2046, continue to titrate.   FWB: Cat I tracing ID: GBS +: adequately treated #cHTN: mild elevation, no severe range BPs or symptoms. Continue labetalol 200 BID #A2GDM: CBG 104>106>108  2047, MD 05/19/2020 9:42 PM

## 2020-05-19 NOTE — Progress Notes (Signed)
Cindy Fletcher is a 25 y.o. G2P0010 at [redacted]w[redacted]d   Subjective: Denies pain, perineal pressure, questions, concerns. Denies HA, visual disturbances, RUQ/epigastric pain.  Objective: BP (!) 141/64   Pulse 76   Temp 97.8 F (36.6 C) (Oral)   Resp 18   Ht 5' 8.5" (1.74 m)   Wt 124.8 kg   LMP 08/26/2019   BMI 41.24 kg/m  No intake/output data recorded. No intake/output data recorded.  FHT:  FHR: 135 bpm, variability: moderate,  accelerations:  Present,  decelerations:  Absent UC:   regular, every 2-4 minutes SVE:   Dilation: 4 Effacement (%): 60,70 Station: Ballotable Exam by:: Mirtha Bloodgood, CNM  Labs: Lab Results  Component Value Date   WBC 12.9 (H) 05/19/2020   HGB 10.3 (L) 05/19/2020   HCT 32.8 (L) 05/19/2020   MCV 85.2 05/19/2020   PLT 258 05/19/2020    Assessment / Plan: --Cat I tracing --GBS +: adequately treated --No significant cervical change from previous exam 4 hours ago. Continue Pitocin titration --Encouraged patient to get out of bed, reposition q 45-60 minutes, use peanut ball when in bed --CHTN: mild elevation, no severe range BPs or severe symptoms --A2GDM: elevated x 1 overnight, now WNL again. Continue CBGs q 4 until active labor --Anticipate NSVD  [name], CNM 05/19/2020, 0530

## 2020-05-19 NOTE — Progress Notes (Signed)
Cindy Fletcher is a 25 y.o. G2P0010 at [redacted]w[redacted]d admitted for IOL due to [place] and A2GDM.  Subjective: Patient resting very comfortably. Feeling some light contractions. No concerns or questions at this time.  Objective: BP (!) 138/58   Pulse 89   Temp 98.7 F (37.1 C) (Oral)   Resp 18   Ht 5' 8.5" (1.74 m)   Wt 124.8 kg   LMP 08/26/2019   SpO2 97%   BMI 41.24 kg/m  No intake/output data recorded. No intake/output data recorded.  FHT:  FHR: 140 bpm, variability: moderate,  accelerations:  Present,  decelerations:  Absent UC: q4-5 mins SVE:   Dilation: 4 Effacement (%): 70 Station: -2 Exam by:: Precious Gilding, MD  Labs: Lab Results  Component Value Date   WBC 12.9 (H) 05/19/2020   HGB 10.3 (L) 05/19/2020   HCT 32.8 (L) 05/19/2020   MCV 85.2 05/19/2020   PLT 258 05/19/2020    Assessment / Plan: Cindy Fletcher is a 25 y.o. G2P0010 at [redacted]w[redacted]d admitted for IOL due to cHTN and A2GDM.  #IOL: s/p cytotec x4, FB, and pitocin x9 hrs. Pit was d/c'd @1000  this morning and patient given cytotec (#4) at that time. Cervix slightly more effaced, but otherwise unchanged from prior exams. Will given additional buccal cytotec and continue to monitor. --Cat I tracing --GBS +: adequately treated #cHTN: mild elevation, no severe range BPs or symptoms. Continue labetalol 200 BID #A2GDM: Most recent glucose 104. Continue CBGs q4 until active labor Anticipate NSVD   , MD PGY-1 [facility] 05/19/2020 3:01 PM

## 2020-05-19 NOTE — Anesthesia Preprocedure Evaluation (Signed)
Anesthesia Evaluation  Patient identified by MRN, date of birth, ID band Patient awake    Reviewed: Allergy & Precautions, NPO status , Patient's Chart, lab work & pertinent test results  Airway Mallampati: II  TM Distance: >3 FB Neck ROM: Full    Dental no notable dental hx.    Pulmonary asthma ,    Pulmonary exam normal breath sounds clear to auscultation       Cardiovascular Exercise Tolerance: Good hypertension, Normal cardiovascular exam Rhythm:Regular Rate:Normal     Neuro/Psych negative neurological ROS  negative psych ROS   GI/Hepatic negative GI ROS, Neg liver ROS,   Endo/Other  diabetes, Gestational  Renal/GU negative Renal ROS  negative genitourinary   Musculoskeletal negative musculoskeletal ROS (+)   Abdominal   Peds negative pediatric ROS (+)  Hematology  (+) anemia ,   Anesthesia Other Findings   Reproductive/Obstetrics (+) Pregnancy                             Anesthesia Physical Anesthesia Plan  ASA: III  Anesthesia Plan: Epidural   Post-op Pain Management:    Induction:   PONV Risk Score and Plan: 2 and Treatment may vary due to age or medical condition  Airway Management Planned: Natural Airway  Additional Equipment:   Intra-op Plan:   Post-operative Plan:   Informed Consent: I have reviewed the patients History and Physical, chart, labs and discussed the procedure including the risks, benefits and alternatives for the proposed anesthesia with the patient or authorized representative who has indicated his/her understanding and acceptance.       Plan Discussed with: Anesthesiologist  Anesthesia Plan Comments:         Anesthesia Quick Evaluation

## 2020-05-19 NOTE — Plan of Care (Signed)
  Problem: Education: Goal: Knowledge of Childbirth will improve Outcome: Progressing Goal: Ability to make informed decisions regarding treatment and plan of care will improve Outcome: Progressing Goal: Ability to state and carry out methods to decrease the pain will improve Outcome: Progressing Goal: Individualized Educational Video(s) Outcome: Progressing   Problem: Coping: Goal: Ability to verbalize concerns and feelings about labor and delivery will improve Outcome: Progressing   Problem: Life Cycle: Goal: Ability to make normal progression through stages of labor will improve Outcome: Progressing Goal: Ability to effectively push during vaginal delivery will improve Outcome: Progressing   Problem: Role Relationship: Goal: Will demonstrate positive interactions with the child Outcome: Progressing   Problem: Safety: Goal: Risk of complications during labor and delivery will decrease Outcome: Progressing   Problem: Pain Management: Goal: Relief or control of pain from uterine contractions will improve Outcome: Progressing   Problem: Education: Goal: Knowledge of General Education information will improve Description: Including pain rating scale, medication(s)/side effects and non-pharmacologic comfort measures Outcome: Progressing   Problem: Health Behavior/Discharge Planning: Goal: Ability to manage health-related needs will improve Outcome: Progressing   Problem: Clinical Measurements: Goal: Ability to maintain clinical measurements within normal limits will improve Outcome: Progressing Goal: Will remain free from infection Outcome: Progressing Goal: Diagnostic test results will improve Outcome: Progressing Goal: Respiratory complications will improve Outcome: Progressing Goal: Cardiovascular complication will be avoided Outcome: Progressing   Problem: Activity: Goal: Risk for activity intolerance will decrease Outcome: Progressing   Problem:  Nutrition: Goal: Adequate nutrition will be maintained Outcome: Progressing   Problem: Coping: Goal: Level of anxiety will decrease Outcome: Progressing   Problem: Elimination: Goal: Will not experience complications related to bowel motility Outcome: Progressing Goal: Will not experience complications related to urinary retention Outcome: Progressing   Problem: Pain Managment: Goal: General experience of comfort will improve Outcome: Progressing   Problem: Safety: Goal: Ability to remain free from injury will improve Outcome: Progressing   Problem: Skin Integrity: Goal: Risk for impaired skin integrity will decrease Outcome: Progressing   Problem: Education: Goal: Knowledge of disease or condition will improve Outcome: Progressing Goal: Knowledge of the prescribed therapeutic regimen will improve Outcome: Progressing   Problem: Fluid Volume: Goal: Peripheral tissue perfusion will improve Outcome: Progressing   Problem: Clinical Measurements: Goal: Complications related to disease process, condition or treatment will be avoided or minimized Outcome: Progressing   

## 2020-05-20 ENCOUNTER — Inpatient Hospital Stay (HOSPITAL_COMMUNITY): Payer: 59 | Admitting: Anesthesiology

## 2020-05-20 ENCOUNTER — Encounter (HOSPITAL_COMMUNITY): Payer: Self-pay | Admitting: Obstetrics and Gynecology

## 2020-05-20 DIAGNOSIS — O1092 Unspecified pre-existing hypertension complicating childbirth: Secondary | ICD-10-CM

## 2020-05-20 DIAGNOSIS — Z3A38 38 weeks gestation of pregnancy: Secondary | ICD-10-CM

## 2020-05-20 DIAGNOSIS — O24424 Gestational diabetes mellitus in childbirth, insulin controlled: Secondary | ICD-10-CM

## 2020-05-20 DIAGNOSIS — O99824 Streptococcus B carrier state complicating childbirth: Secondary | ICD-10-CM

## 2020-05-20 LAB — GLUCOSE, CAPILLARY
Glucose-Capillary: 92 mg/dL (ref 70–99)
Glucose-Capillary: 96 mg/dL (ref 70–99)
Glucose-Capillary: 88 mg/dL (ref 70–99)

## 2020-05-20 MED ORDER — TETANUS-DIPHTH-ACELL PERTUSSIS 5-2.5-18.5 LF-MCG/0.5 IM SUSY: 0.5000 mL | PREFILLED_SYRINGE | Freq: Once | INTRAMUSCULAR | Status: DC

## 2020-05-20 MED ORDER — SIMETHICONE 80 MG PO CHEW: 80.0000 mg | CHEWABLE_TABLET | ORAL | Status: DC | PRN

## 2020-05-20 MED ORDER — DIPHENHYDRAMINE HCL 25 MG PO CAPS: 25.0000 mg | ORAL_CAPSULE | Freq: Four times a day (QID) | ORAL | Status: DC | PRN

## 2020-05-20 MED ORDER — DIBUCAINE (PERIANAL) 1 % EX OINT: 1.0000 "application " | TOPICAL_OINTMENT | CUTANEOUS | Status: DC | PRN

## 2020-05-20 MED ORDER — LACTATED RINGERS AMNIOINFUSION: INTRAVENOUS | Status: DC

## 2020-05-20 MED ORDER — COCONUT OIL OIL
1.0000 "application " | TOPICAL_OIL | Status: DC | PRN
  Administered 2020-05-21: 1 via TOPICAL

## 2020-05-20 MED ORDER — FERROUS SULFATE 325 (65 FE) MG PO TABS
325.0000 mg | ORAL_TABLET | ORAL | Status: DC
  Filled 2020-05-20: qty 1

## 2020-05-20 MED ORDER — ONDANSETRON HCL 4 MG PO TABS: 4.0000 mg | ORAL_TABLET | ORAL | Status: DC | PRN

## 2020-05-20 MED ORDER — LIDOCAINE HCL (PF) 1 % IJ SOLN
INTRAMUSCULAR | Status: DC | PRN
  Administered 2020-05-20: 8 mL via EPIDURAL

## 2020-05-20 MED ORDER — IBUPROFEN 600 MG PO TABS
600.0000 mg | ORAL_TABLET | Freq: Four times a day (QID) | ORAL | Status: DC
  Administered 2020-05-20 – 2020-05-22 (×8): 600 mg via ORAL
  Filled 2020-05-20 (×9): qty 1

## 2020-05-20 MED ORDER — SENNOSIDES-DOCUSATE SODIUM 8.6-50 MG PO TABS
2.0000 | ORAL_TABLET | Freq: Every day | ORAL | Status: DC
  Administered 2020-05-21: 2 via ORAL
  Filled 2020-05-20 (×2): qty 2

## 2020-05-20 MED ORDER — WITCH HAZEL-GLYCERIN EX PADS
1.0000 | MEDICATED_PAD | CUTANEOUS | Status: DC | PRN
Start: 2020-05-20 — End: 2020-05-22

## 2020-05-20 MED ORDER — BENZOCAINE-MENTHOL 20-0.5 % EX AERO
1.0000 "application " | INHALATION_SPRAY | CUTANEOUS | Status: DC | PRN
  Administered 2020-05-20: 1 via TOPICAL
  Filled 2020-05-20 (×2): qty 56

## 2020-05-20 MED ORDER — AMLODIPINE BESYLATE 5 MG PO TABS
5.0000 mg | ORAL_TABLET | Freq: Every day | ORAL | Status: DC
  Administered 2020-05-21: 5 mg via ORAL
  Filled 2020-05-20 (×2): qty 1

## 2020-05-20 MED ORDER — PRENATAL MULTIVITAMIN CH
1.0000 | ORAL_TABLET | Freq: Every day | ORAL | Status: DC
  Administered 2020-05-21: 1 via ORAL
  Filled 2020-05-20 (×2): qty 1

## 2020-05-20 MED ORDER — ZOLPIDEM TARTRATE 5 MG PO TABS
5.0000 mg | ORAL_TABLET | Freq: Every evening | ORAL | Status: DC | PRN
Start: 2020-05-20 — End: 2020-05-22

## 2020-05-20 MED ORDER — ACETAMINOPHEN 325 MG PO TABS: 650.0000 mg | ORAL_TABLET | ORAL | Status: DC | PRN

## 2020-05-20 MED ORDER — ONDANSETRON HCL 4 MG/2ML IJ SOLN: 4.0000 mg | INTRAMUSCULAR | Status: DC | PRN

## 2020-05-20 NOTE — Lactation Note (Signed)
This note was copied from a baby's chart. Lactation Consultation Note  Patient Name: Cindy Fletcher Today's Date: 05/20/2020 Reason for consult: L&D Initial assessment Age:25 hours   P1, Baby cueing.  Assisted with latching on both breasts.  Baby tongue thrusting coming off and on breast. Reviewed with FOB how to assist mother with guiding baby back on breast.    Maternal Data Does the patient have breastfeeding experience prior to this delivery?: No  Feeding Mother's Current Feeding Choice: Breast Milk  LATCH Score Latch: Repeated attempts needed to sustain latch, nipple held in mouth throughout feeding, stimulation needed to elicit sucking reflex.  Audible Swallowing: A few with stimulation  Type of Nipple: Everted at rest and after stimulation  Comfort (Breast/Nipple): Soft / non-tender  Hold (Positioning): Assistance needed to correctly position infant at breast and maintain latch.  LATCH Score: 7    Interventions Interventions: Breast feeding basics reviewed;Assisted with latch;Skin to skin;Education   Consult Status Consult Status: Follow-up Date: 05/20/20 Follow-up type: In-patient    Dahlia Byes [facility] 05/20/2020, 11:00 AM

## 2020-05-20 NOTE — Anesthesia Procedure Notes (Signed)
Epidural Patient location during procedure: OB Start time: 05/20/2020 12:00 AM End time: 05/20/2020 12:15 AM  Staffing Anesthesiologist: Mellody Dance, MD Performed: anesthesiologist   Preanesthetic Checklist Completed: patient identified, IV checked, site marked, risks and benefits discussed, monitors and equipment checked, pre-op evaluation and timeout performed  Epidural Patient position: sitting Prep: DuraPrep Patient monitoring: heart rate, cardiac monitor, continuous pulse ox and blood pressure Approach: midline Location: L2-L3 Injection technique: LOR saline  Needle:  Needle type: Tuohy  Needle gauge: 17 G Needle length: 9 cm Needle insertion depth: 9 cm Catheter type: closed end flexible Catheter size: 20 Guage Catheter at skin depth: 14 cm Test dose: negative and Other  Assessment Events: blood not aspirated, injection not painful, no injection resistance and negative IV test  Additional Notes Informed consent obtained prior to proceeding including risk of failure, 1% risk of PDPH, risk of minor discomfort and bruising.  Discussed rare but serious complications including epidural abscess, permanent nerve injury, epidural hematoma.  Discussed alternatives to epidural analgesia and patient desires to proceed.  Timeout performed pre-procedure verifying patient name, procedure, and platelet count.  Patient tolerated procedure well.

## 2020-05-20 NOTE — Lactation Note (Signed)
This note was copied from a baby's chart. Lactation Consultation Note  Patient Name: Cindy Fletcher Date: 05/20/2020 Reason for consult: Initial assessment;Mother's request;Primapara;1st time breastfeeding;Term;Maternal endocrine disorder (Gest HTN, POP for birth control) Age: 25 hrs  LC received a call from RN, Wonda Horner to assist Mom with latching. On arrival, infant in the nursery for temperature regulation. LC reviewed with Mom feeding cues, STS and how to get a deep latching using different positions.   Mom had breast changes during pregnancy. Mom leaking colostrum for the last 3 weeks.  Manual pump set up with 27 flange size.  Mom to pre pump q 3hrs for 10 minutes.   Plan 1. To feed based on cues 8-12x in 24 hrs no more than 4hrs without an attempt. Mom to offer both breasts during feedings and look for signs of milk transfer.           2. If Mom cannot get infant to latch, she has 5 EBM to offer via finger or spoon feeding.            3. I and O sheet reviewed.             4. LC brochure of inpatient and outpatient services reviewed.  Mom has a pacifier in the room. LC reviewed with Mom early use of a pacifier can prevent noting feeding cues. Current recommendations to hold off on pacifier use until establish a good latch.   Maternal Data Has patient been taught Hand Expression?: Yes Does the patient have breastfeeding experience prior to this delivery?: No  Feeding Mother's Current Feeding Choice: Breast Milk  LATCH Score Latch: Grasps breast easily, tongue down, lips flanged, rhythmical sucking.  Audible Swallowing: None  Type of Nipple: Everted at rest and after stimulation  Comfort (Breast/Nipple): Soft / non-tender  Hold (Positioning): Assistance needed to correctly position infant at breast and maintain latch.  LATCH Score: 7   Lactation Tools Discussed/Used    Interventions Interventions: Breast feeding basics reviewed;Hand pump;Skin to  skin;Support pillows;Breast massage;Position options;Hand express;Expressed milk;Education;Pre-pump if needed  Discharge Pump: Personal WIC Program: No  Consult Status Consult Status: Follow-up Date: 05/21/20 Follow-up type: In-patient    Cindy Defilippo  Fletcher 05/20/2020, 5:02 PM

## 2020-05-20 NOTE — Progress Notes (Signed)
Cindy Fletcher is a 25 y.o. G2P0010 at [redacted]w[redacted]d admitted for IOL due to [place] and A2GDM.  Subjective: Feeling better w epidural   Objective: BP 112/88   Pulse 79   Temp 99.2 F (37.3 C) (Axillary)   Resp 17   Ht 5' 8.5" (1.74 m)   Wt 124.8 kg   LMP 08/26/2019   SpO2 100%   BMI 41.24 kg/m  No intake/output data recorded. No intake/output data recorded.  FHT:  FHR: 150 bpm, variability: moderate,  accelerations:  Present,  decelerations:  Few variable  UC: q4-5 mins SVE:   Dilation: 5 Effacement (%): 80 Station: -2 Exam by:: Maily Debarge, MD  Labs: Lab Results  Component Value Date   WBC 14.7 (H) 05/19/2020   HGB 10.2 (L) 05/19/2020   HCT 31.9 (L) 05/19/2020   MCV 85.3 05/19/2020   PLT 245 05/19/2020    Assessment / Plan: Cindy Fletcher is a 25 y.o. G2P0010 at [redacted]w[redacted]d admitted for IOL due to cHTN and A2GDM.  #IOL: s/p cytotec x3, FB, and pitocin x9 hrs. Pit was d/c'd @1000  this morning. Cytotec x2. Restarted pit at 2046, now at 12. AROM at 0045 for light mec fluid.  FWB: Cat I tracing ID: GBS +: adequately treated #cHTN: mild elevation, no severe range BPs or symptoms. Continue labetalol 200 BID #A2GDM: CBG 104>106>108  2047, MD 05/20/2020 12:54 AM

## 2020-05-20 NOTE — Progress Notes (Signed)
Cindy Fletcher is a 25 y.o. G2P0010 at [redacted]w[redacted]d admitted for IOL due to [place] and A2GDM.  Subjective: Got some sleep, feels contractions mildly   Objective: BP (!) 133/55   Pulse 81   Temp 98.9 F (37.2 C) (Oral)   Resp 18   Ht 5' 8.5" (1.74 m)   Wt 124.8 kg   LMP 08/26/2019   SpO2 100%   BMI 41.24 kg/m  No intake/output data recorded. Total I/O In: -  Out: 2225 [Urine:2225]  FHT:  FHR: 150 bpm, variability: moderate,  accelerations:  Present,  decelerations:  none UC: q4-5 mins SVE:   Dilation: 6.5 Effacement (%): 80 Station: 0 Exam by:: Lorina Rabon, RN  Labs: Lab Results  Component Value Date   WBC 14.7 (H) 05/19/2020   HGB 10.2 (L) 05/19/2020   HCT 31.9 (L) 05/19/2020   MCV 85.3 05/19/2020   PLT 245 05/19/2020    Assessment / Plan: Cindy Fletcher is a 25 y.o. G2P0010 at [redacted]w[redacted]d admitted for IOL due to cHTN and A2GDM.  #IOL: s/p cytotec x3, FB, and pitocin x9 hrs. Pit was d/c'd @1000  this morning. Cytotec x2. Restarted pit at 2046, now at 22. AROM at 0045 for light mec fluid. Continue to titrate pitocin. Consider IUPC at next check if minimal change.   FWB: Cat I tracing ID: GBS +: adequately treated #cHTN: mild elevation, no severe range BPs or symptoms. Continue labetalol 200 BID #A2GDM: CBG 108>92>96  2047, MD 05/20/2020 6:56 AM

## 2020-05-20 NOTE — Anesthesia Postprocedure Evaluation (Signed)
Anesthesia Post Note  Patient: Cindy Fletcher  Procedure(s) Performed: AN AD HOC LABOR EPIDURAL     Patient location during evaluation: Mother Baby Anesthesia Type: Epidural Level of consciousness: awake and alert and oriented Pain management: satisfactory to patient Vital Signs Assessment: post-procedure vital signs reviewed and stable Respiratory status: respiratory function stable Cardiovascular status: stable Postop Assessment: no headache, no backache, epidural receding, patient able to bend at knees, no signs of nausea or vomiting and adequate PO intake Anesthetic complications: no   No complications documented.  Last Vitals:  Vitals:   05/20/20 1300 05/20/20 1410  BP: (!) 145/68 (!) 143/61  Pulse: 65 80  Resp: 20 18  Temp: 37.3 C 37.4 C  SpO2:      Last Pain:  Vitals:   05/20/20 1616  TempSrc:   PainSc: 0-No pain   Pain Goal: Patients Stated Pain Goal: 0 (05/19/20 0656)                 Karleen Dolphin

## 2020-05-20 NOTE — Discharge Summary (Signed)
Postpartum Discharge Summary   Patient Name: Cindy Fletcher DOB: [birth year] MRN: [id number]  Date of admission: 05/18/2020 Delivery date:05/20/2020  Delivering provider: Manya Silvas  Date of discharge: 05/22/2020  Admitting diagnosis: Gestational diabetes mellitus (GDM) in childbirth, diet controlled [O24.420] Intrauterine pregnancy: [redacted]w[redacted]d    Secondary diagnosis:  Active Problems:   Supervision of high risk pregnancy, antepartum   COVID-19 affecting pregnancy in third trimester   Limited prenatal care in third trimester   Chronic hypertension complicating or reason for care during pregnancy, third trimester   Gestational diabetes mellitus (GDM) in childbirth, diet controlled  Additional problems: none    Discharge diagnosis: Term Pregnancy Delivered, CHTN and GDM A2                                              Post partum procedures:none Augmentation: AROM, Pitocin, Cytotec and IP Foley Complications: None  Hospital course: Induction of Labor With Vaginal Delivery   25y.o. yo G2P0010 at 336w2das admitted to the hospital 05/18/2020 for induction of labor.  Indication for induction: poorly controlled A2 DM and CHTN.  Patient had an uncomplicated labor course as follows: Membrane Rupture Time/Date: 12:47 AM ,05/20/2020   Delivery Method:Vaginal, Spontaneous  Episiotomy: None  Lacerations:  None  Details of delivery can be found in separate delivery note.  Patient had a uncomplicated routine postpartum course. Patient is discharged home 05/22/20.  Newborn Data: Birth date:05/20/2020  Birth time:10:08 AM  Gender:Female  Living status:Living  Apgars:9 ,9  Weight:3209 g   Magnesium Sulfate received: No BMZ received: No Rhophylac:N/A MMR:N/A T-DaP:Given prenatally Flu: No Transfusion:No  Physical exam  Vitals:   05/21/20 0110 05/21/20 0512 05/21/20 1614 05/22/20 0633  BP: 122/62 (!) 144/73 137/70 (!) 148/81  Pulse: 74 65 72 (!) 57  Resp: '18 17 20   ' Temp: 98.2 F (36.8  C) 98 F (36.7 C) 98.2 F (36.8 C) 98.2 F (36.8 C)  TempSrc: Oral Oral Oral Oral  SpO2: 99% 99% 99% 100%  Weight:      Height:       General: alert, cooperative and no distress Lochia: appropriate Uterine Fundus: firm Incision: N/A DVT Evaluation: No evidence of DVT seen on physical exam. Negative Homan's sign. No cords or calf tenderness. No significant calf/ankle edema. Labs: Lab Results  Component Value Date   WBC 14.7 (H) 05/19/2020   HGB 10.2 (L) 05/19/2020   HCT 31.9 (L) 05/19/2020   MCV 85.3 05/19/2020   PLT 245 05/19/2020   CMP Latest Ref Rng & Units 05/18/2020  Glucose 70 - 99 mg/dL 88  BUN 6 - 20 mg/dL 5(L)  Creatinine 0.44 - 1.00 mg/dL 0.51  Sodium 135 - 145 mmol/L 136  Potassium 3.5 - 5.1 mmol/L 4.1  Chloride 98 - 111 mmol/L 106  CO2 22 - 32 mmol/L 22  Calcium 8.9 - 10.3 mg/dL 8.9  Total Protein 6.5 - 8.1 g/dL 6.2(L)  Total Bilirubin 0.3 - 1.2 mg/dL 0.8  Alkaline Phos 38 - 126 U/L 131(H)  AST 15 - 41 U/L 24  ALT 0 - 44 U/L 15   Edinburgh Score: Edinburgh Postnatal Depression Scale Screening Tool 05/21/2020  I have been able to laugh and see the funny side of things. 0  I have looked forward with enjoyment to things. 0  I have blamed myself unnecessarily when things went  wrong. 2  I have been anxious or worried for no good reason. 0  I have felt scared or panicky for no good reason. 0  Things have been getting on top of me. 1  I have been so unhappy that I have had difficulty sleeping. 0  I have felt sad or miserable. 0  I have been so unhappy that I have been crying. 0  The thought of harming myself has occurred to me. 0  Edinburgh Postnatal Depression Scale Total 3     After visit meds:  Allergies as of 05/22/2020   No Known Allergies     Medication List    STOP taking these medications   Blood Pressure Cuff Misc   labetalol 200 MG tablet Commonly known as: NORMODYNE   metFORMIN 500 MG tablet Commonly known as: Glucophage   ONE TOUCH  ULTRA 2 w/Device Kit   OneTouch Delica Lancets 74J Misc   OneTouch Ultra test strip Generic drug: glucose blood     TAKE these medications   amLODipine 10 MG tablet Commonly known as: NORVASC Take 1 tablet (10 mg total) by mouth daily.   coconut oil Oil Apply 1 application topically as needed (nipple pain).   ferrous sulfate 325 (65 FE) MG tablet Take 1 tablet (325 mg total) by mouth every other day.   ibuprofen 600 MG tablet Commonly known as: ADVIL Take 1 tablet (600 mg total) by mouth every 6 (six) hours.   norethindrone 0.35 MG tablet Commonly known as: MICRONOR Take 1 tablet (0.35 mg total) by mouth daily.   Vitafol Gummies 3.33-0.333-34.8 MG Chew Chew 3 each by mouth daily.       Discharge home in stable condition Infant Feeding: Breast Infant Disposition:home with mother Discharge instruction: per After Visit Summary and Postpartum booklet. Activity: Advance as tolerated. Pelvic rest for 6 weeks.  Diet: routine diet Future Appointments: Future Appointments  Date Time Provider [place]  05/23/2020 10:00 AM [place] None  07/02/2020  8:45 AM [facility] None  07/02/2020  1:59 AM Arrie Senate, MD [place] None   Follow up Visit:  Follow-up Information    [place]. Schedule an appointment as soon as possible for a visit in 4 week(s).   Why: Make appointment to be seen in 1 week for blood pressure check and 4 weeks for postpartum appointment  Contact information: [address] [place] [address] [phone]               Please schedule this patient for a In person postpartum visit in 6 weeks with the following provider: Any provider. Needs 2 hour GTT.  Additional Postpartum F/U:BP check 2-3 days  High risk pregnancy complicated by: GDM and HTN Delivery mode:  Vaginal, Spontaneous  Anticipated Birth Control:  POPs   05/22/2020 Lajean Manes, CNM

## 2020-05-21 LAB — GLUCOSE, CAPILLARY: Glucose-Capillary: 108 mg/dL — ABNORMAL HIGH (ref 70–99)

## 2020-05-21 NOTE — Social Work (Signed)
CSW received consult for late and limited PNC.  CSW reviewed chart and is screening out consult as it does not meet criteria for automatic CSW involvement and infant drug screening.  MOB started care prior to 28 weeks and had more than 3 visits.  Please contact CSW if current concerns arise or by MOB's request.  Peter Keyworth, MSW, LCSWA Clinical Social Work [facility]  [phone] 

## 2020-05-21 NOTE — Lactation Note (Signed)
This note was copied from a baby's chart. Lactation Consultation Note Baby 15 hrs old. Mom stated she used hand pump and collected 2 ml colostrum. Mom wanted to know if she would get more if she used DEBP. LC explained probably not it would mainly be for stimulation. Mom thought she would get a lot of milk. Explained concept of colostrum and composition of BM.  After lab finished w/heel stick, LC placed baby to breast for feeding. Took a while for baby to finally feed well. Baby tongue thrust and sucks on her top lip. Placed baby in football position for feeding to obtain a deeper latch. Answered moms questions. Praised her for her hard work.  Patient Name: Cindy Fletcher [id number] Date: 05/21/2020 Reason for consult: Follow-up assessment;Difficult latch;Primapara;Maternal endocrine disorder Age:25 hours  Maternal Data Has patient been taught Hand Expression?: Yes Does the patient have breastfeeding experience prior to this delivery?: No  Feeding Mother's Current Feeding Choice: Breast Milk and Formula Nipple Type: Slow - flow  LATCH Score Latch: Repeated attempts needed to sustain latch, nipple held in mouth throughout feeding, stimulation needed to elicit sucking reflex.  Audible Swallowing: None  Type of Nipple: Everted at rest and after stimulation  Comfort (Breast/Nipple): Filling, red/small blisters or bruises, mild/mod discomfort (mom sore. mom states Lt. nipple has crack, looks like Nipple tissue crease.)  Hold (Positioning): Assistance needed to correctly position infant at breast and maintain latch.  LATCH Score: 5   Lactation Tools Discussed/Used Tools: Pump (mom doesn't have her bra. was going to give shells.) Breast pump type: Manual Pump Education: Milk Storage  Interventions Interventions: Breast feeding basics reviewed;Adjust position;Assisted with latch;Support pillows;Skin to skin;Position options;Education;Expressed milk;Breast massage;Hand  express;Breast compression  Discharge Pump: Manual  Consult Status Consult Status: Follow-up Date: 05/21/20 Follow-up type: In-patient    Charyl Dancer 05/21/2020, 1:49 AM

## 2020-05-21 NOTE — Progress Notes (Addendum)
POSTPARTUM PROGRESS NOTE  Post Partum Day 1  Subjective:  Cindy Fletcher is a 25 y.o. G2P1011 s/p NSVD at [redacted]w[redacted]d.  She reports she is doing well. No acute events overnight. She denies any problems with ambulating, voiding or po intake. Denies nausea or vomiting.  Pain is well controlled.  Lochia is mild.  Objective: Blood pressure (!) 144/73, pulse 65, temperature 98 F (36.7 C), temperature source Oral, resp. rate 17, height 5' 8.5" (1.74 m), weight 124.8 kg, last menstrual period 08/26/2019, SpO2 99 %, unknown if currently breastfeeding.  Physical Exam:  General: alert, cooperative and no distress Chest: no respiratory distress Heart:regular rate, distal pulses intact Abdomen: soft, nontender,  Uterine Fundus: firm, appropriately tender DVT Evaluation: No calf swelling or tenderness Extremities: Mo edema Skin: warm, dry  Recent Labs    05/19/20 0131 05/19/20 2258  HGB 10.3* 10.2*  HCT 32.8* 31.9*    Assessment/Plan: Cindy Fletcher is a 25 y.o. G2P1011 s/p SVDat [redacted]w[redacted]d   PPD#1 - Doing well  Routine postpartum care A2GDM: Fasting glucose this morning 108. Will need repeat GTT at least 6 weeks post partum.  CHTN: Mild range Bps, continue Norvasc 5 mg daily. Continue to monitor BPs Contraception: POPs Feeding: Breast  Dispo: Plan for discharge 3/8.   LOS: 3 days   Derrel Nip, MD  PGY-2, [facility]  05/21/2020, 5:36 AM  I saw and evaluated the patient. I agree with the findings and the plan of care as documented in the resident's note.  Casper Harrison, MD [facility] Family Medicine Fellow, [facility] for [facility], [facility]

## 2020-05-21 NOTE — Lactation Note (Signed)
This note was copied from a baby's chart. Lactation Consultation Note  Patient Name: Cindy Fletcher Today's Date: 05/21/2020 Reason for consult: Early term 37-38.6wks;Primapara;1st time breastfeeding Age:25 hours   P1 mother whose infant is now 13 hours old.  This is an ETI at 38+2 weeks.  Mother's feeding preference is breast/bottle.  It had been three hours since baby's last feeding.  Offered to assist with latching and mother agreeable.  Taught hand expression and mother able to obtain one drop of colostrum.  Assess baby's suck and performed suck training to obtain a strong grasp.  Assisted to latch to the breast in the football hold.  Initially, baby needed constant stimulation to begin sucking.  Once she began sucking, intermittent stimulation and breast compressions helped her feed for 18 minutes.  Comprehensive education on breast feeding completed.  Suggested mother begin pumping with the DEBP after every feeding while father provides the supplementation.  Set up pump and observed mother using the #24 flanges.  Demonstrated paced bottle feeding for the parents and changed baby from the gold nipple to the purple extra slow flow nipple.  Baby consumed 7 mls with me demonstrating before she had a pasty green stool.  Asked father to change diaper and continue feeding 10-20 mls total now that she is >24 hours.  Demonstrated burping.  Mother has a DEBP for home use.  Grandmother present for assistance.  Mother will call for any further questions/concerns.  RN updated.   Maternal Data Has patient been taught Hand Expression?: Yes Does the patient have breastfeeding experience prior to this delivery?: No  Feeding Mother's Current Feeding Choice: Breast Milk and Formula Nipple Type: Slow - flow  LATCH Score Latch: Repeated attempts needed to sustain latch, nipple held in mouth throughout feeding, stimulation needed to elicit sucking reflex.  Audible Swallowing: None  Type of Nipple:  Everted at rest and after stimulation  Comfort (Breast/Nipple): Soft / non-tender  Hold (Positioning): Assistance needed to correctly position infant at breast and maintain latch.  LATCH Score: 6   Lactation Tools Discussed/Used Breast pump type: Double-Electric Breast Pump;Manual Pump Education: Setup, frequency, and cleaning;Milk Storage Reason for Pumping: ETI; breast stimulation Pumping frequency: Every three hours  Interventions Interventions: Breast feeding basics reviewed;Assisted with latch;Skin to skin;Breast massage;Hand express;Breast compression;Adjust position;DEBP;Hand pump;Position options;Support pillows;Education  Discharge Pump: DEBP;Manual;Personal  Consult Status Consult Status: Follow-up Date: 05/22/20 Follow-up type: In-patient    Cindy Fletcher R Lenoard Helbert 05/21/2020, 10:43 AM

## 2020-05-21 NOTE — Progress Notes (Signed)
Pt wishes to clarify that her hypertension started at end of pregnancy and is not chronic hypertension as well as her diagnosis of GDM was at end of her pregnancy.Maternal grandmother at bedside also concerned about pts medical history being correct.

## 2020-05-22 ENCOUNTER — Other Ambulatory Visit: Payer: Self-pay | Admitting: Certified Nurse Midwife

## 2020-05-22 MED ORDER — COCONUT OIL OIL: 1.0000 "application " | TOPICAL_OIL | 0 refills | Status: DC | PRN

## 2020-05-22 MED ORDER — AMLODIPINE BESYLATE 5 MG PO TABS
10.0000 mg | ORAL_TABLET | Freq: Every day | ORAL | Status: DC
  Filled 2020-05-22: qty 2

## 2020-05-22 MED ORDER — AMLODIPINE BESYLATE 10 MG PO TABS: 10.0000 mg | ORAL_TABLET | Freq: Every day | ORAL | 1 refills | Status: DC

## 2020-05-22 MED ORDER — IBUPROFEN 600 MG PO TABS: 600.0000 mg | ORAL_TABLET | Freq: Four times a day (QID) | ORAL | 0 refills | Status: DC

## 2020-05-22 MED ORDER — NORETHINDRONE 0.35 MG PO TABS: 1.0000 | ORAL_TABLET | Freq: Every day | ORAL | 3 refills | Status: DC

## 2020-05-22 MED ORDER — FERROUS SULFATE 325 (65 FE) MG PO TABS: 325.0000 mg | ORAL_TABLET | ORAL | 3 refills | Status: DC

## 2020-05-22 MED FILL — FERROUS SULFATE 325 MG TAB: 325 (65 FE) | 60 days supply | Qty: 30 | Fill #0

## 2020-05-22 MED FILL — IBUPROFEN 600 MG TABLET: 600 | 8 days supply | Qty: 30 | Fill #0

## 2020-05-22 MED FILL — AMLODIPINE BESYLATE 10 MG T: 10 | 30 days supply | Qty: 30 | Fill #0

## 2020-05-22 MED FILL — NORETHINDRONE 0.35 MG TAB: 0.35 | 84 days supply | Qty: 84 | Fill #0

## 2020-05-22 NOTE — Lactation Note (Signed)
This note was copied from a baby's chart. Lactation Consultation Note  Patient Name: Cindy Fletcher [id number] Date: 05/22/2020 Reason for consult: Follow-up assessment Age:25 hours  P1 mother whose infant is now 72 hours old.  This is an ETI at 38+2 weeks.  Mother's feeding preference is breast/bottle.  Mother has been breast feeding and supplementing with formula.  Baby has, at times, taken large volumes of formula.  Supplementation guideline sheet has been reviewed with mother.    Engorgement prevention/treatment reviewed.  Mother has a manual pump and a DEBP for home use.  Father present and asleep on the couch.  RN in room at the end of my visit.  Mother has our OP phone number for questions/concerns after discharge.   Maternal Data    Feeding Nipple Type: Nfant Slow Flow (purple)  LATCH Score                    Lactation Tools Discussed/Used    Interventions Interventions: Education  Discharge Discharge Education: Engorgement and breast care  Consult Status Consult Status: Complete Date: 05/22/20 Follow-up type: Call as needed    Chera Slivka R Jaquese Irving 05/22/2020, 7:49 AM

## 2020-05-23 ENCOUNTER — Ambulatory Visit: Payer: BC Managed Care – PPO

## 2020-05-25 ENCOUNTER — Inpatient Hospital Stay (HOSPITAL_COMMUNITY): Payer: 59

## 2020-06-11 ENCOUNTER — Ambulatory Visit (INDEPENDENT_AMBULATORY_CARE_PROVIDER_SITE_OTHER): Payer: 59 | Admitting: Licensed Clinical Social Worker

## 2020-06-11 DIAGNOSIS — F53 Postpartum depression: Secondary | ICD-10-CM

## 2020-06-11 DIAGNOSIS — O99345 Other mental disorders complicating the puerperium: Secondary | ICD-10-CM

## 2020-06-12 NOTE — BH Specialist Note (Signed)
Integrated Behavioral Health via Telemedicine Visit  06/12/2020 Rosina Cressler [id number]  Number of Integrated Behavioral Health visits: 1/6 Session Start time: 2:33pm  Session End time: 3:03pm Total time: 30 mins via mychart video   Referring Provider: hospital discharge  Patient/Family location: Home  [facility] Provider location: [facility]  All persons participating in visit: Pt T. [place] and LCSWA A. Isami Mehra  Types of Service: Individual Psychotherapy   I connected with Vernell Leep and/or Enid Derry Hasley's n/a via  Telephone or Video Enabled Telemedicine Application  (Video is Caregility application) and verified that I am speaking with the correct person using two identifiers. Discussed confidentiality: yes  I discussed the limitations of telemedicine and the availability of in person appointments.  Discussed there is a possibility of technology failure and discussed alternative modes of communication if that failure occurs.  I discussed that engaging in this telemedicine visit, they consent to the provision of behavioral healthcare and the services will be billed under their insurance.  Patient and/or legal guardian expressed understanding and consented to Telemedicine visit: yes   Presenting Concerns: Patient and/or family reports the following symptoms/concerns: difficulty bonding with newborn, feelings of guilt, excessive worrry Duration of problem: approx two weeks  ; Severity of problem: moderate   Patient and/or Family's Strengths/Protective Factors: Secured connections in place such as supportive family and partner, secured housing   Goals Addressed: Patient will: 1.  Reduce symptoms of: postpartum depression   2.  Increase knowledge and/or ability of: coping skills   3.  Demonstrate ability to: alleviate and self manage symptoms   Progress towards Goals: Ongoing   Interventions: Interventions utilized:  Individual psychotherapy   Standardized Assessments completed: edinburgh 06/11/2020 score 14   Assessment: Patient currently experiencing postpartum depression   Patient may benefit from integrated behavioral health   Plan: 1. Follow up with behavioral health clinician on : 2 weeks via mychart  2. Behavioral recommendations: keep all scheduled appts, communicate needs and delegate task to supportive family to prevent burnout, take walks (weather permitting) to boost mood  3. Referral(s): n/a  I discussed the assessment and treatment plan with the patient and/or parent/guardian. They were provided an opportunity to ask questions and all were answered. They agreed with the plan and demonstrated an understanding of the instructions.   They were advised to call back or seek an in-person evaluation if the symptoms worsen or if the condition fails to improve as anticipated.  [name], LCSW

## 2020-07-02 ENCOUNTER — Other Ambulatory Visit: Payer: BC Managed Care – PPO

## 2020-07-02 ENCOUNTER — Encounter: Payer: 59 | Admitting: Licensed Clinical Social Worker

## 2020-07-02 ENCOUNTER — Telehealth: Payer: Self-pay | Admitting: Licensed Clinical Social Worker

## 2020-07-02 ENCOUNTER — Ambulatory Visit: Payer: BC Managed Care – PPO | Admitting: Family Medicine

## 2020-07-02 NOTE — Telephone Encounter (Signed)
Called pt regarding schedule mychart appt. Pt no show appt

## 2020-07-03 ENCOUNTER — Other Ambulatory Visit: Payer: BC Managed Care – PPO

## 2020-07-09 ENCOUNTER — Ambulatory Visit: Payer: BC Managed Care – PPO | Admitting: Obstetrics and Gynecology

## 2020-07-09 ENCOUNTER — Other Ambulatory Visit: Payer: BC Managed Care – PPO

## 2020-08-09 ENCOUNTER — Telehealth: Payer: Self-pay

## 2020-08-09 NOTE — Telephone Encounter (Signed)
Pt left VM today requesting refill on birth Control pills Pt has not had any PP F/U or B/P check Message sent to Dr.Constant if 1 month supply can be sent until pt is seen. Will await providers response.

## 2020-08-10 ENCOUNTER — Other Ambulatory Visit: Payer: Self-pay

## 2020-08-10 DIAGNOSIS — Z309 Encounter for contraceptive management, unspecified: Secondary | ICD-10-CM

## 2020-08-10 MED ORDER — NORETHINDRONE 0.35 MG PO TABS: 1.0000 | ORAL_TABLET | Freq: Every day | ORAL | 0 refills | Status: DC

## 2020-08-10 NOTE — Progress Notes (Signed)
Pt will need appt before any other refills can be sent per Dr.Constant Due to pt not having PP or B/P check appts.

## 2020-09-13 ENCOUNTER — Other Ambulatory Visit: Payer: Self-pay

## 2020-09-13 ENCOUNTER — Encounter: Payer: Self-pay | Admitting: Advanced Practice Midwife

## 2020-09-13 ENCOUNTER — Ambulatory Visit (INDEPENDENT_AMBULATORY_CARE_PROVIDER_SITE_OTHER): Payer: BC Managed Care – PPO | Admitting: Advanced Practice Midwife

## 2020-09-13 DIAGNOSIS — I10 Essential (primary) hypertension: Secondary | ICD-10-CM

## 2020-09-13 DIAGNOSIS — Z8632 Personal history of gestational diabetes: Secondary | ICD-10-CM

## 2020-09-13 DIAGNOSIS — Z9189 Other specified personal risk factors, not elsewhere classified: Secondary | ICD-10-CM

## 2020-09-13 NOTE — Progress Notes (Signed)
Post Partum Visit Note  Cindy Fletcher is a 25 y.o. G1P1011 female who presents for a postpartum visit. She is  16  weeks postpartum following a normal spontaneous vaginal delivery.  I have fully reviewed the prenatal and intrapartum course. The delivery was at 38.2 gestational weeks.  Anesthesia: epidural. Postpartum course has been unremarkable. Baby is doing well. Baby is feeding by breast. Bleeding no bleeding. Bowel function is normal. Bladder function is normal. Patient is sexually active. Contraception method is condoms. Postpartum depression screening: negative=2.   The pregnancy intention screening data noted above was reviewed. Potential methods of contraception were discussed. The patient elected to proceed with Abstinence but considering Nexplanon at later visit.   Edinburgh Postnatal Depression Scale - 09/13/20 0851       Edinburgh Postnatal Depression Scale:  In the Past 7 Days   I have been able to laugh and see the funny side of things. 0    I have looked forward with enjoyment to things. 0    I have blamed myself unnecessarily when things went wrong. 0    I have been anxious or worried for no good reason. 0    I have felt scared or panicky for no good reason. 0    Things have been getting on top of me. 0    I have been so unhappy that I have had difficulty sleeping. 2    I have felt sad or miserable. 0    I have been so unhappy that I have been crying. 0    The thought of harming myself has occurred to me. 0    Edinburgh Postnatal Depression Scale Total 2             Health Maintenance Due  Topic Date Due   COVID-19 Vaccine (1) Never done   URINE MICROALBUMIN  Never done   HPV VACCINES (1 - 2-dose series) Never done    The following portions of the patient's history were reviewed and updated as appropriate: allergies, current medications, past family history, past medical history, past social history, past surgical history, and problem list.  Review  of Systems A comprehensive review of systems was negative.  Objective:  BP 133/82   Pulse 99   Ht 5' 8.5" (1.74 m)   Wt 245 lb (111.1 kg)   LMP 08/31/2020 (Exact Date)   Breastfeeding Yes   BMI 36.71 kg/m    VS reviewed, nursing note reviewed,  Constitutional: well developed, well nourished, no distress HEENT: normocephalic CV: normal rate Pulm/chest wall: normal effort Abdomen: soft Neuro: alert and oriented x 3 Skin: warm, dry Psych: affect normal  Assessment:    There are no diagnoses linked to this encounter.  1. Postpartum exam See below  2. History of gestational diabetes --Pt not scheduled for 2 hour GTT. PT to return for GTT at her convenience but A1C done toay. - Hemoglobin A1c  3. Chronic hypertension --Not taking meds and is normotensive today. --BP check at return visit  4. At risk for postpartum depression --Pt reports some mood changes initially in first 4-5 weeks after delivery, improved but not resolved today. She denies any thoughts of harming herself or others.   - Ambulatory referral to Integrated Behavioral Health   Plan:   Essential components of care per ACOG recommendations:  1.  Mood and well being: Patient with negative depression screening today. Reviewed local resources for support.  - Patient tobacco use? No.   - hx  of drug use? No.    2. Infant care and feeding:  -Patient currently breastmilk feeding? Yes. Discussed returning to work and pumping. Reviewed importance of draining breast regularly to support lactation.  -Social determinants of health (SDOH) reviewed in EPIC. No concerns  3. Sexuality, contraception and birth spacing - Patient does not want a pregnancy in the next year. - Reviewed forms of contraception in tiered fashion. Patient desired  Nexplanon possibly but plans to reschedule for contraceptive visit.     - Discussed birth spacing of 18 months  4. Sleep and fatigue -Encouraged family/partner/community support of  4 hrs of uninterrupted sleep to help with mood and fatigue  5. Physical Recovery  - Discussed patients delivery and complications. She describes her labor as good. - Patient had a Vaginal, no problems at delivery. Patient had a  intact perineum   Perineal healing reviewed. Patient expressed understanding - Patient has urinary incontinence? No. - Patient is safe to resume physical and sexual activity  6.  Health Maintenance - HM due items addressed No -   - Last pap smear 11/07/19 Diagnosis  Date Value Ref Range Status  11/07/2019 (A)  Final   - Atypical squamous cells of undetermined significance (ASC-US)   Pap smear not done at today's visit.  -Breast Cancer screening indicated? No.   7. Chronic Disease/Pregnancy Condition follow up: Hypertension  - PCP follow up  [facility] [employer], [facility]

## 2020-09-13 NOTE — Patient Instructions (Signed)
Provider Recommendation: Go to www.bedsider.org for excellent up to date information about contraceptive choices and more.

## 2020-09-14 LAB — HEMOGLOBIN A1C
Est. average glucose Bld gHb Est-mCnc: 114 mg/dL
Hgb A1c MFr Bld: 5.6 % (ref 4.8–5.6)

## 2020-09-18 ENCOUNTER — Encounter: Payer: BC Managed Care – PPO | Admitting: Licensed Clinical Social Worker

## 2020-09-18 ENCOUNTER — Telehealth: Payer: Self-pay | Admitting: Licensed Clinical Social Worker

## 2020-09-18 NOTE — Telephone Encounter (Signed)
Called pt twice regarding scheduled mychart visit. Left voicemail requesting callback

## 2020-10-23 ENCOUNTER — Ambulatory Visit (INDEPENDENT_AMBULATORY_CARE_PROVIDER_SITE_OTHER): Payer: BC Managed Care – PPO

## 2020-10-23 ENCOUNTER — Other Ambulatory Visit: Payer: Self-pay

## 2020-10-23 VITALS — BP 134/84 | HR 90 | Ht 68.5 in | Wt 249.8 lb

## 2020-10-23 DIAGNOSIS — O0990 Supervision of high risk pregnancy, unspecified, unspecified trimester: Secondary | ICD-10-CM

## 2020-10-23 DIAGNOSIS — N912 Amenorrhea, unspecified: Secondary | ICD-10-CM

## 2020-10-23 LAB — POCT URINE PREGNANCY: Preg Test, Ur: POSITIVE — AB

## 2020-10-23 MED ORDER — VITAFOL GUMMIES 3.33-0.333-34.8 MG PO CHEW: 3.0000 | CHEWABLE_TABLET | Freq: Every day | ORAL | 12 refills | Status: DC

## 2020-10-23 NOTE — Addendum Note (Signed)
Addended by: Charlsie Quest B on: 10/23/2020 01:59 PM   Modules accepted: Orders

## 2020-10-23 NOTE — Progress Notes (Signed)
Cindy Fletcher presents today for UPT. She has no unusual complaints. LMP:    OBJECTIVE: Appears well, in no apparent distress.  OB History     Gravida  2   Para  1   Term  1   Preterm      AB  1   Living  1      SAB  1   IAB      Ectopic      Multiple  0   Live Births  1          Home UPT Result: 09/29/20 Positive In-Office UPT result: 10/23/20 Positive I have reviewed the patient's medical, obstetrical, social, and family histories, and medications.   ASSESSMENT: Positive pregnancy test  PLAN Prenatal care to be completed at:  [facility]

## 2020-10-31 ENCOUNTER — Other Ambulatory Visit: Payer: Self-pay

## 2020-10-31 ENCOUNTER — Ambulatory Visit (INDEPENDENT_AMBULATORY_CARE_PROVIDER_SITE_OTHER): Payer: BC Managed Care – PPO

## 2020-10-31 VITALS — BP 128/82 | HR 96 | Ht 68.5 in | Wt 248.0 lb

## 2020-10-31 DIAGNOSIS — Z3A01 Less than 8 weeks gestation of pregnancy: Secondary | ICD-10-CM

## 2020-10-31 DIAGNOSIS — Z3481 Encounter for supervision of other normal pregnancy, first trimester: Secondary | ICD-10-CM

## 2020-10-31 DIAGNOSIS — O0990 Supervision of high risk pregnancy, unspecified, unspecified trimester: Secondary | ICD-10-CM | POA: Insufficient documentation

## 2020-10-31 DIAGNOSIS — O3680X0 Pregnancy with inconclusive fetal viability, not applicable or unspecified: Secondary | ICD-10-CM

## 2020-10-31 DIAGNOSIS — Z3491 Encounter for supervision of normal pregnancy, unspecified, first trimester: Secondary | ICD-10-CM | POA: Insufficient documentation

## 2020-10-31 NOTE — Progress Notes (Signed)
New OB Intake  I connected with  Cindy Fletcher on 10/31/20 at  8:15 AM EDT by in person. Video Visit and verified that I am speaking with the correct person using two identifiers. Nurse is located at [facility] and pt is located at [place].  I discussed the limitations, risks, security and privacy concerns of performing an evaluation and management service by telephone and the availability of in person appointments. I also discussed with the patient that there may be a patient responsible charge related to this service. The patient expressed understanding and agreed to proceed.  I explained I am completing New OB Intake today. We discussed her EDD of 06/20/21 that is based on early u/s. Pt is G3/P1011. I reviewed her allergies, medications, Medical/Surgical/OB history, and appropriate screenings. I informed her of [facility] services. Based on history, this is a/an  pregnancy complicated by hypertension .   Patient Active Problem List   Diagnosis Date Noted   Gestational diabetes mellitus (GDM) in childbirth, diet controlled 05/18/2020   GBS (group B Streptococcus carrier), +RV culture, currently pregnant 05/11/2020   GDM, class A2 05/01/2020   Limited prenatal care in third trimester 04/09/2020   Chronic hypertension complicating or reason for care during pregnancy, third trimester 04/09/2020   COVID-19 affecting pregnancy in third trimester 03/14/2020   ASCUS of cervix with negative high risk HPV 11/14/2019   Short cervix in non-gravid uterus 11/08/2019   Supervision of high risk pregnancy, antepartum 11/07/2019   Miscarriage 08/01/2019    Concerns addressed today  Delivery Plans:  Plans to deliver at [facility].   MyChart/Babyscripts MyChart access verified. I explained pt will have some visits in office and some virtually. Babyscripts instructions given and order placed. Patient verifies receipt of registration text/e-mail. Account successfully created and app downloaded.  Blood  Pressure Cuff  Patient has blood pressure cuff at home from previous pregnancy. Explained after first prenatal appt pt will check weekly and document in Babyscripts.  Weight scale: Patient instructed to purchase weight scale.  Anatomy US Explained first scheduled [place] will be around 19 weeks. Dating and viability scan performed today.  Labs Discussed Avelina Laine genetic screening with patient. Would like both Panorama and Horizon drawn at new OB visit. Routine prenatal labs needed.  Covid Vaccine Patient has not covid vaccine.   Mother/ Baby Dyad Candidate?    If yes, offer as possibility  Informed patient of [facility] website  and placed link in her AVS.   Social Determinants of Health Food Insecurity: Patient denies food insecurity. WIC Referral: Patient is interested in referral to [facility].  Transportation: Patient denies transportation needs. Childcare: Discussed no children allowed at ultrasound appointments. Offered childcare services; patient declines childcare services at this time.   Placed OB Box on problem list and updated  First visit review I reviewed new OB appt with pt. I explained she will have a pelvic exam, ob bloodwork with genetic screening, and PAP smear. Explained pt will be seen by Coral Ceo at first visit; encounter routed to appropriate provider. Explained that patient will be seen by pregnancy navigator following visit with provider. [facility] information placed in AVS.   Hamilton Capri, RN 10/31/2020  8:26 AM

## 2020-10-31 NOTE — Progress Notes (Signed)
Patient was assessed and managed by nursing staff during this encounter. I have reviewed the chart and agree with the documentation and plan. I have also made any necessary editorial changes.  Warden Fillers, MD 10/31/2020 9:55 AM

## 2020-11-16 ENCOUNTER — Encounter: Payer: Self-pay | Admitting: Obstetrics and Gynecology

## 2020-11-16 ENCOUNTER — Telehealth: Payer: Self-pay | Admitting: Obstetrics and Gynecology

## 2020-11-16 NOTE — Progress Notes (Unsigned)
Received phone call from [facility] with elevated BP and Ha IUP 9 weeks BP 131/90 Called pt no answer.

## 2020-11-16 NOTE — Telephone Encounter (Signed)
Received call from [facility] about elevated BP. Attempted to call, no answer

## 2020-11-18 ENCOUNTER — Inpatient Hospital Stay (HOSPITAL_COMMUNITY)
Admission: AD | Admit: 2020-11-18 | Discharge: 2020-11-18 | Disposition: A | Discharge status: Home / Self Care | Payer: BC Managed Care – PPO | Attending: Obstetrics and Gynecology | Admitting: Obstetrics and Gynecology

## 2020-11-18 ENCOUNTER — Other Ambulatory Visit: Payer: Self-pay

## 2020-11-18 DIAGNOSIS — Z3A09 9 weeks gestation of pregnancy: Secondary | ICD-10-CM | POA: Diagnosis not present

## 2020-11-18 DIAGNOSIS — Z8249 Family history of ischemic heart disease and other diseases of the circulatory system: Secondary | ICD-10-CM | POA: Diagnosis not present

## 2020-11-18 DIAGNOSIS — K529 Noninfective gastroenteritis and colitis, unspecified: Secondary | ICD-10-CM | POA: Diagnosis not present

## 2020-11-18 DIAGNOSIS — Z3481 Encounter for supervision of other normal pregnancy, first trimester: Secondary | ICD-10-CM

## 2020-11-18 DIAGNOSIS — O99611 Diseases of the digestive system complicating pregnancy, first trimester: Secondary | ICD-10-CM | POA: Diagnosis not present

## 2020-11-18 DIAGNOSIS — O209 Hemorrhage in early pregnancy, unspecified: Secondary | ICD-10-CM | POA: Diagnosis not present

## 2020-11-18 LAB — CBC WITH DIFFERENTIAL/PLATELET
Abs Immature Granulocytes: 0.02 10*3/uL (ref 0.00–0.07)
Basophils Absolute: 0 10*3/uL (ref 0.0–0.1)
Basophils Relative: 0 %
Eosinophils Absolute: 0 10*3/uL (ref 0.0–0.5)
Eosinophils Relative: 0 %
HCT: 37.9 % (ref 36.0–46.0)
Hemoglobin: 12.7 g/dL (ref 12.0–15.0)
Immature Granulocytes: 0 %
Lymphocytes Relative: 31 %
Lymphs Abs: 2.2 10*3/uL (ref 0.7–4.0)
MCH: 29.1 pg (ref 26.0–34.0)
MCHC: 33.5 g/dL (ref 30.0–36.0)
MCV: 86.9 fL (ref 80.0–100.0)
Monocytes Absolute: 0.5 10*3/uL (ref 0.1–1.0)
Monocytes Relative: 7 %
Neutro Abs: 4.2 10*3/uL (ref 1.7–7.7)
Neutrophils Relative %: 62 %
Platelets: 254 10*3/uL (ref 150–400)
RBC: 4.36 MIL/uL (ref 3.87–5.11)
RDW: 12.7 % (ref 11.5–15.5)
WBC: 6.9 10*3/uL (ref 4.0–10.5)
nRBC: 0 % (ref 0.0–0.2)

## 2020-11-18 LAB — URINALYSIS, ROUTINE W REFLEX MICROSCOPIC
Bilirubin Urine: NEGATIVE
Glucose, UA: NEGATIVE mg/dL
Hgb urine dipstick: NEGATIVE
Ketones, ur: NEGATIVE mg/dL
Leukocytes,Ua: NEGATIVE
Nitrite: NEGATIVE
Protein, ur: NEGATIVE mg/dL
Specific Gravity, Urine: 1.024 (ref 1.005–1.030)
pH: 6 (ref 5.0–8.0)

## 2020-11-18 LAB — COMPREHENSIVE METABOLIC PANEL
ALT: 13 U/L (ref 0–44)
AST: 17 U/L (ref 15–41)
Albumin: 3.3 g/dL — ABNORMAL LOW (ref 3.5–5.0)
Alkaline Phosphatase: 69 U/L (ref 38–126)
Anion gap: 6 (ref 5–15)
BUN: <5 mg/dL — ABNORMAL LOW (ref 6–20)
CO2: 27 mmol/L (ref 22–32)
Calcium: 9.1 mg/dL (ref 8.9–10.3)
Chloride: 101 mmol/L (ref 98–111)
Creatinine, Ser: 0.45 mg/dL (ref 0.44–1.00)
GFR, Estimated: >60 mL/min (ref >60)
Glucose, Bld: 91 mg/dL (ref 70–99)
Potassium: 3.7 mmol/L (ref 3.5–5.1)
Sodium: 134 mmol/L — ABNORMAL LOW (ref 135–145)
Total Bilirubin: 0.6 mg/dL (ref 0.3–1.2)
Total Protein: 6.7 g/dL (ref 6.5–8.1)

## 2020-11-18 MED ORDER — LACTATED RINGERS IV BOLUS: 1000.0000 mL | Freq: Once | INTRAVENOUS | Status: DC

## 2020-11-18 MED ORDER — LOPERAMIDE HCL 2 MG PO CAPS
4.0000 mg | ORAL_CAPSULE | Freq: Once | ORAL | Status: AC
  Administered 2020-11-18: 4 mg via ORAL
  Filled 2020-11-18: qty 2

## 2020-11-18 NOTE — MAU Note (Signed)
G3P1 at 9.3 weeks  by early [place].  Reports that three days ago started having nausea and diarrhea, 2 days ago started having sharp pains in lower abdomen that has increased since then.  Denies any GI problems in the past.  Stool is watery at times and more formed at times.  No one else in her household is sick.  Taking PNV. H/O GHTN, taking BP at home daily.

## 2020-11-18 NOTE — MAU Note (Signed)
...  Cindy Fletcher is a 25 y.o. at [redacted]w[redacted]d here in MAU reporting: intermittent sharp abdominal pain "a couple of inches below my belly button" that has been occurring for 2 days lasting "5-7 seconds" each. She states three days ago she began having diarrhea that occurs every couple of hours and is yellow/brown. She states she has been cooking meals at home and has not eaten anything out of the norm. No VB.   BP: 138/80 P: 108 T: 98.9 oral R: 17 O2: 100%    Pain score:  6/10 lower abdominal   Lab orders placed from triage: UA

## 2020-11-18 NOTE — MAU Provider Note (Signed)
History     CSN: [id number]  Arrival date and time: 11/18/20 1032   None     Chief Complaint  Patient presents with   Abdominal Pain   Vaginal Bleeding   Cindy Fletcher is a 25 y.o. G3P1011 at [redacted]w[redacted]d who receives care at [facility].  She presents today for Abdominal Pain and Diarrhea.  She states she has been having abdominal pain that started after onset of diarrhea.  She describes it as "really really bad stomach ache." She reports some confusion d/t pregnancy and feels she can differentiate if it is due to pregnancy or something else.  She states the pain is intermittent and rates it a 5-6/10.  She reports she has had diarrhea that is watery about every 2 hours since onset 3 days ago.  She reports that some occurrences are not watery.  She states she has not taken anything for her symptoms.    Last ate yesterday [place] food: Enchiladas      OB History     Gravida  3   Para  1   Term  1   Preterm      AB  1   Living  1      SAB  1   IAB      Ectopic      Multiple  0   Live Births  1           Past Medical History:  Diagnosis Date   Anemia    Asthma    Gestational diabetes    Hypertension     Past Surgical History:  Procedure Laterality Date   NO PAST SURGERIES      Family History  Problem Relation Age of Onset   Diabetes Mother    ADD / ADHD Father    Asthma Father    ADD / ADHD Sister    Hypertension Paternal Aunt    Diabetes Maternal Grandmother    Hypertension Maternal Grandmother    ADD / ADHD Paternal Grandmother    Hypertension Paternal Grandmother    ADD / ADHD Paternal Grandfather    Asthma Paternal Grandfather    Hypertension Paternal Grandfather     Social History   Tobacco Use   Smoking status: Never   Smokeless tobacco: Never  Vaping Use   Vaping Use: Never used  Substance Use Topics   Alcohol use: Not Currently   Drug use: Not Currently    Types: Marijuana    Comment: last used Dec 2020    Allergies:  No Known Allergies  Medications Prior to Admission  Medication Sig Dispense Refill Last Dose   Prenatal Vit-Fe Phos-FA-Omega (VITAFOL GUMMIES) 3.33-0.333-34.8 MG CHEW Chew 3 each by mouth daily. 90 tablet 12     Review of Systems  Constitutional:  Negative for chills and fever.  Gastrointestinal:  Positive for abdominal pain and nausea. Negative for vomiting.  Genitourinary:  Negative for difficulty urinating, dysuria, vaginal bleeding and vaginal discharge.  Neurological:  Positive for headaches. Negative for dizziness and light-headedness.  Physical Exam   Blood pressure 138/80, pulse (!) 108, temperature 98.7 F (37.1 C), temperature source Oral, resp. rate 17, height 5' 8.5" (1.74 m), weight 114.4 kg, last menstrual period 08/30/2020, SpO2 100 %, currently breastfeeding.  Physical Exam Constitutional:      Appearance: Normal appearance. She is well-developed.  HENT:     Head: Normocephalic and atraumatic.  Eyes:     Conjunctiva/sclera: Conjunctivae normal.  Cardiovascular:  Rate and Rhythm: Normal rate and regular rhythm.     Heart sounds: Normal heart sounds.  Pulmonary:     Effort: Pulmonary effort is normal. No respiratory distress.     Breath sounds: Normal breath sounds.  Abdominal:     General: Bowel sounds are normal.     Palpations: Abdomen is soft.     Tenderness: There is abdominal tenderness in the right lower quadrant and left lower quadrant.  Musculoskeletal:        General: Normal range of motion.     Cervical back: Normal range of motion.  Skin:    General: Skin is warm and dry.  Neurological:     Mental Status: She is alert and oriented to person, place, and time.  Psychiatric:        Mood and Affect: Mood normal.        Behavior: Behavior normal.    MAU Course  Procedures Results for orders placed or performed during the hospital encounter of 11/18/20 (from the past 24 hour(s))  Urinalysis, Routine w reflex microscopic Urine, Clean Catch      Status: Abnormal   Collection Time: 11/18/20 11:12 AM  Result Value Ref Range   Color, Urine YELLOW YELLOW   APPearance HAZY (A) CLEAR   Specific Gravity, Urine 1.024 1.005 - 1.030   pH 6.0 5.0 - 8.0   Glucose, UA NEGATIVE NEGATIVE mg/dL   Hgb urine dipstick NEGATIVE NEGATIVE   Bilirubin Urine NEGATIVE NEGATIVE   Ketones, ur NEGATIVE NEGATIVE mg/dL   Protein, ur NEGATIVE NEGATIVE mg/dL   Nitrite NEGATIVE NEGATIVE   Leukocytes,Ua NEGATIVE NEGATIVE  CBC with Differential/Platelet     Status: None   Collection Time: 11/18/20  1:41 PM  Result Value Ref Range   WBC 6.9 4.0 - 10.5 K/uL   RBC 4.36 3.87 - 5.11 MIL/uL   Hemoglobin 12.7 12.0 - 15.0 g/dL   HCT 29.9 24.2 - 68.3 %   MCV 86.9 80.0 - 100.0 fL   MCH 29.1 26.0 - 34.0 pg   MCHC 33.5 30.0 - 36.0 g/dL   RDW 41.9 62.2 - 29.7 %   Platelets 254 150 - 400 K/uL   nRBC 0.0 0.0 - 0.2 %   Neutrophils Relative % 62 %   Neutro Abs 4.2 1.7 - 7.7 K/uL   Lymphocytes Relative 31 %   Lymphs Abs 2.2 0.7 - 4.0 K/uL   Monocytes Relative 7 %   Monocytes Absolute 0.5 0.1 - 1.0 K/uL   Eosinophils Relative 0 %   Eosinophils Absolute 0.0 0.0 - 0.5 K/uL   Basophils Relative 0 %   Basophils Absolute 0.0 0.0 - 0.1 K/uL   Immature Granulocytes 0 %   Abs Immature Granulocytes 0.02 0.00 - 0.07 K/uL    MDM Labs: UA, CBC/D, CMP Start IV LR Fluids  Assessment and Plan  25 year old G3P1011 at 9.3 weeks Gastroenteritis   -POC Reviewed -Informed of likely diagnosis. -Discussed treatment with antidiarrhetic, bland diet, and hydration. -Will start IV and collect labs. -Patient encouraged to leave urine specimen when able. -Will give imodium 4mg  for relief of diarrhea. -Will monitor and reassess.   11/18/2020, 1:23 PM   Reassessment (2:20 PM)  -Nurse reports unable to successfully place IV. -Reports gave patient options for oral consumption and patient declines. -Patient requesting discharge. -AVS includes information for  gastroentertitis -Discharged to home in stable condition.  01/18/2021 MSN, CNM Advanced Practice Provider, [facility]

## 2020-12-07 ENCOUNTER — Ambulatory Visit (INDEPENDENT_AMBULATORY_CARE_PROVIDER_SITE_OTHER): Payer: BC Managed Care – PPO | Admitting: Obstetrics

## 2020-12-07 ENCOUNTER — Other Ambulatory Visit: Payer: Self-pay

## 2020-12-07 ENCOUNTER — Encounter: Payer: Self-pay | Admitting: Obstetrics

## 2020-12-07 VITALS — BP 134/86 | HR 93 | Wt 251.0 lb

## 2020-12-07 DIAGNOSIS — Z3493 Encounter for supervision of normal pregnancy, unspecified, third trimester: Secondary | ICD-10-CM | POA: Diagnosis not present

## 2020-12-07 DIAGNOSIS — Z3482 Encounter for supervision of other normal pregnancy, second trimester: Secondary | ICD-10-CM | POA: Diagnosis not present

## 2020-12-07 DIAGNOSIS — Z3480 Encounter for supervision of other normal pregnancy, unspecified trimester: Secondary | ICD-10-CM | POA: Diagnosis not present

## 2020-12-07 DIAGNOSIS — Z3A36 36 weeks gestation of pregnancy: Secondary | ICD-10-CM | POA: Diagnosis not present

## 2020-12-07 DIAGNOSIS — Z31430 Encounter of female for testing for genetic disease carrier status for procreative management: Secondary | ICD-10-CM | POA: Diagnosis not present

## 2020-12-07 DIAGNOSIS — O24410 Gestational diabetes mellitus in pregnancy, diet controlled: Secondary | ICD-10-CM | POA: Diagnosis not present

## 2020-12-07 DIAGNOSIS — Z22330 Carrier of Group B streptococcus: Secondary | ICD-10-CM | POA: Diagnosis not present

## 2020-12-07 DIAGNOSIS — O10913 Unspecified pre-existing hypertension complicating pregnancy, third trimester: Secondary | ICD-10-CM | POA: Diagnosis not present

## 2020-12-07 LAB — HEPATITIS C ANTIBODY: HCV Ab: NEGATIVE

## 2020-12-07 NOTE — Progress Notes (Signed)
NEW OB, wants to see only Female Providers. She is not comfortable with a Female provider.  Declined FLU vaccine.

## 2020-12-07 NOTE — Progress Notes (Signed)
Subjective:    Cindy Fletcher is being seen today for her first obstetrical visit.  This is not a planned pregnancy. She is at [redacted]w[redacted]d gestation. Her obstetrical history is significant for group B strep colonizer, obesity, and pregnancy induced hypertension. Relationship with FOB: significant other, not living together. Patient does intend to breast feed. Pregnancy history fully reviewed.  The information documented in the HPI was reviewed and verified.  Menstrual History: OB History     Gravida  3   Para  1   Term  1   Preterm      AB  1   Living  1      SAB  1   IAB      Ectopic      Multiple  0   Live Births  1            Patient's last menstrual period was 08/30/2020 (exact date).    Past Medical History:  Diagnosis Date   Anemia    Asthma    Gestational diabetes    Hypertension     Past Surgical History:  Procedure Laterality Date   NO PAST SURGERIES      (Not in a hospital admission)  No Known Allergies  Social History   Tobacco Use   Smoking status: Never   Smokeless tobacco: Never  Substance Use Topics   Alcohol use: Not Currently    Family History  Problem Relation Age of Onset   Diabetes Mother    ADD / ADHD Father    Asthma Father    ADD / ADHD Sister    Hypertension Paternal Aunt    Diabetes Maternal Grandmother    Hypertension Maternal Grandmother    ADD / ADHD Paternal Grandmother    Hypertension Paternal Grandmother    ADD / ADHD Paternal Grandfather    Asthma Paternal Grandfather    Hypertension Paternal Grandfather      Review of Systems Constitutional: negative for weight loss Gastrointestinal: negative for vomiting Genitourinary:negative for genital lesions and vaginal discharge and dysuria Musculoskeletal:negative for back pain Behavioral/Psych: negative for abusive relationship, depression, illegal drug usage and tobacco use    Objective:    BP 134/86   Pulse 93   Wt 251 lb (113.9 kg)   LMP 08/30/2020  (Exact Date)   BMI 37.61 kg/m  General Appearance:    Alert, cooperative, no distress, appears stated age  Head:    Normocephalic, without obvious abnormality, atraumatic  Eyes:    PERRL, conjunctiva/corneas clear, EOM's intact, fundi    benign, both eyes  Ears:    Normal TM's and external ear canals, both ears  Nose:   Nares normal, septum midline, mucosa normal, no drainage    or sinus tenderness  Throat:   Lips, mucosa, and tongue normal; teeth and gums normal  Neck:   Supple, symmetrical, trachea midline, no adenopathy;    thyroid:  no enlargement/tenderness/nodules; no carotid   bruit or JVD  Back:     Symmetric, no curvature, ROM normal, no CVA tenderness  Lungs:     Clear to auscultation bilaterally, respirations unlabored  Chest Wall:    No tenderness or deformity   Heart:    Regular rate and rhythm, S1 and S2 normal, no murmur, rub   or gallop  Breast Exam:    No tenderness, masses, or nipple abnormality  Abdomen:     Soft, non-tender, bowel sounds active all four quadrants,    no masses, no organomegaly  Genitalia:    Normal female without lesion, discharge or tenderness  Extremities:   Extremities normal, atraumatic, no cyanosis or edema  Pulses:   2+ and symmetric all extremities  Skin:   Skin color, texture, turgor normal, no rashes or lesions  Lymph nodes:   Cervical, supraclavicular, and axillary nodes normal  Neurologic:   CNII-XII intact, normal strength, sensation and reflexes    throughout      Lab Review Urine pregnancy test Labs reviewed yes Radiologic studies reviewed yes  Assessment:    Pregnancy at [redacted]w[redacted]d weeks    Plan:      Prenatal vitamins.  Counseling provided regarding continued use of seat belts, cessation of alcohol consumption, smoking or use of illicit drugs; infection precautions i.e., influenza/TDAP immunizations, toxoplasmosis,CMV, parvovirus, listeria and varicella; workplace safety, exercise during pregnancy; routine dental care, safe  medications, sexual activity, hot tubs, saunas, pools, travel, caffeine use, fish and methlymercury, potential toxins, hair treatments, varicose veins Weight gain recommendations per IOM guidelines reviewed: underweight/BMI< 18.5--> gain 28 - 40 lbs; normal weight/BMI 18.5 - 24.9--> gain 25 - 35 lbs; overweight/BMI 25 - 29.9--> gain 15 - 25 lbs; obese/BMI >30->gain  11 - 20 lbs Problem list reviewed and updated. FIRST/CF mutation testing/NIPT/QUAD SCREEN/fragile X/Ashkenazi Jewish population testing/Spinal muscular atrophy discussed: requested. Role of ultrasound in pregnancy discussed; fetal survey: requested. Amniocentesis discussed: not indicated.  No orders of the defined types were placed in this encounter.  Orders Placed This Encounter  Procedures   Culture, OB Urine   [place] MFM OB COMP + 14 WK    Standing Status:   Future    Standing Expiration Date:   12/07/2021    Order Specific Question:   Reason for Exam (SYMPTOM  OR DIAGNOSIS REQUIRED)    Answer:   Anatomy    Order Specific Question:   Preferred Location    Answer:   [facility] Ultrasound   Genetic Screening   Obstetric Panel, Including HIV   Hepatitis C Antibody    Follow up in 4 weeks.  I have spent a total of 20 minutes of face-to-face and time, excluding clinical staff time, reviewing notes and preparing to see patient, ordering tests and/or medications, and counseling the patient.   Brock Bad, MD 12/07/2020 8:42 AM

## 2020-12-08 LAB — OBSTETRIC PANEL, INCLUDING HIV
Antibody Screen: NEGATIVE
Basophils Absolute: 0 10*3/uL (ref 0.0–0.2)
Basos: 0 %
EOS (ABSOLUTE): 0 10*3/uL (ref 0.0–0.4)
Eos: 0 %
HIV Screen 4th Generation wRfx: NONREACTIVE
Hematocrit: 39.4 % (ref 34.0–46.6)
Hemoglobin: 13 g/dL (ref 11.1–15.9)
Hepatitis B Surface Ag: NEGATIVE
Immature Grans (Abs): 0 10*3/uL (ref 0.0–0.1)
Immature Granulocytes: 0 %
Lymphocytes Absolute: 1.9 10*3/uL (ref 0.7–3.1)
Lymphs: 27 %
MCH: 29 pg (ref 26.6–33.0)
MCHC: 33 g/dL (ref 31.5–35.7)
MCV: 88 fL (ref 79–97)
Monocytes Absolute: 0.4 10*3/uL (ref 0.1–0.9)
Monocytes: 6 %
Neutrophils Absolute: 4.8 10*3/uL (ref 1.4–7.0)
Neutrophils: 67 %
Platelets: 267 10*3/uL (ref 150–450)
RBC: 4.49 x10E6/uL (ref 3.77–5.28)
RDW: 12.8 % (ref 11.7–15.4)
RPR Ser Ql: NONREACTIVE
Rh Factor: POSITIVE
Rubella Antibodies, IGG: 6.5 index (ref >0.99)
WBC: 7.1 10*3/uL (ref 3.4–10.8)

## 2020-12-08 LAB — HEPATITIS C ANTIBODY: Hep C Virus Ab: <0.1 s/co ratio (ref 0.0–0.9)

## 2020-12-08 LAB — HEMOGLOBIN A1C
Est. average glucose Bld gHb Est-mCnc: 117 mg/dL
Hgb A1c MFr Bld: 5.7 % — ABNORMAL HIGH (ref 4.8–5.6)

## 2020-12-09 LAB — CULTURE, OB URINE

## 2020-12-09 LAB — URINE CULTURE, OB REFLEX

## 2020-12-10 ENCOUNTER — Encounter: Payer: Self-pay | Admitting: *Deleted

## 2020-12-10 NOTE — Progress Notes (Signed)
TC to patient to inform of prediabetes and of need to come in early at 8 am for GTT on 01/04/21. Pt is agreeable. Patient given information about GTT test. Patient advised not to eat or drink anything after midnight the day of the test. Pt voices understanding. MyChart message sent including above information and pt education on prediabetes and GTT.

## 2020-12-17 ENCOUNTER — Encounter: Payer: Self-pay | Admitting: Obstetrics

## 2021-01-04 ENCOUNTER — Other Ambulatory Visit: Payer: BC Managed Care – PPO

## 2021-01-04 ENCOUNTER — Encounter: Payer: BC Managed Care – PPO | Admitting: Obstetrics and Gynecology

## 2021-01-22 ENCOUNTER — Ambulatory Visit: Payer: BC Managed Care – PPO

## 2021-01-28 ENCOUNTER — Other Ambulatory Visit: Payer: Self-pay

## 2021-01-28 ENCOUNTER — Inpatient Hospital Stay (HOSPITAL_COMMUNITY)
Admission: AD | Admit: 2021-01-28 | Discharge: 2021-01-28 | Disposition: A | Discharge status: Home / Self Care | Payer: BC Managed Care – PPO | Attending: Family Medicine | Admitting: Family Medicine

## 2021-01-28 ENCOUNTER — Ambulatory Visit: Payer: BC Managed Care – PPO

## 2021-01-28 DIAGNOSIS — Z8616 Personal history of COVID-19: Secondary | ICD-10-CM | POA: Insufficient documentation

## 2021-01-28 DIAGNOSIS — O26892 Other specified pregnancy related conditions, second trimester: Secondary | ICD-10-CM | POA: Diagnosis not present

## 2021-01-28 DIAGNOSIS — Z3A19 19 weeks gestation of pregnancy: Secondary | ICD-10-CM

## 2021-01-28 DIAGNOSIS — R051 Acute cough: Secondary | ICD-10-CM | POA: Diagnosis not present

## 2021-01-28 DIAGNOSIS — Z20822 Contact with and (suspected) exposure to covid-19: Secondary | ICD-10-CM | POA: Insufficient documentation

## 2021-01-28 DIAGNOSIS — Z3492 Encounter for supervision of normal pregnancy, unspecified, second trimester: Secondary | ICD-10-CM | POA: Diagnosis not present

## 2021-01-28 DIAGNOSIS — Z20828 Contact with and (suspected) exposure to other viral communicable diseases: Secondary | ICD-10-CM

## 2021-01-28 LAB — RESP PANEL BY RT-PCR (FLU A&B, COVID) ARPGX2
Influenza A by PCR: NEGATIVE
Influenza B by PCR: NEGATIVE
SARS Coronavirus 2 by RT PCR: NEGATIVE

## 2021-01-28 MED ORDER — BENZONATATE 100 MG PO CAPS: 100.0000 mg | ORAL_CAPSULE | Freq: Three times a day (TID) | ORAL | 0 refills | Status: DC

## 2021-01-28 NOTE — Discharge Instructions (Signed)

## 2021-01-28 NOTE — MAU Note (Signed)
Has been sick for the past week, was just a cough, seemed like it was getting better until today. Woke up this morning, body was sore. Fever of 100.9. sinus drainage and feels it when she coughs.

## 2021-01-28 NOTE — MAU Provider Note (Signed)
History     CSN: [id number]  Arrival date and time: 01/28/21 1426   None     Chief Complaint  Patient presents with   Fever   Cough   Generalized Body Aches   Nasal Congestion   HPI Cindy Fletcher is a 25 y.o. G3P1011 at [redacted]w[redacted]d who presents to MAU with chief complaint of cough. This is a new problem, onset once week ago. Patient states her symptoms started shortly after being visited by ill relatives. Cough is non-productive. She denies chest pain, SOB, weakness, syncope. She is managing cough with OTC cough medicine she found with the help of her pharmacist. She is not experiencing the desired level of relief.  Patient also reports fever of 100.9, body aches and sinus congestion. All new onset yesterday. She had COVID last December. She has not received COVID or Flu vaccinations. She denies abdominal pain, vaginal bleeding, dysuria, constipation, falls, or recent illness.   Patient receives care with [facility].  OB History     Gravida  3   Para  1   Term  1   Preterm      AB  1   Living  1      SAB  1   IAB      Ectopic      Multiple  0   Live Births  1           Past Medical History:  Diagnosis Date   Anemia    Asthma    Gestational diabetes    Hypertension     Past Surgical History:  Procedure Laterality Date   NO PAST SURGERIES      Family History  Problem Relation Age of Onset   Diabetes Mother    ADD / ADHD Father    Asthma Father    ADD / ADHD Sister    Hypertension Paternal Aunt    Diabetes Maternal Grandmother    Hypertension Maternal Grandmother    ADD / ADHD Paternal Grandmother    Hypertension Paternal Grandmother    ADD / ADHD Paternal Grandfather    Asthma Paternal Grandfather    Hypertension Paternal Grandfather     Social History   Tobacco Use   Smoking status: Never   Smokeless tobacco: Never  Vaping Use   Vaping Use: Never used  Substance Use Topics   Alcohol use: Not Currently   Drug use: Not  Currently    Types: Marijuana    Comment: last used Dec 2020    Allergies: No Known Allergies  Medications Prior to Admission  Medication Sig Dispense Refill Last Dose   Prenatal Vit-Fe Phos-FA-Omega (VITAFOL GUMMIES) 3.33-0.333-34.8 MG CHEW Chew 3 each by mouth daily. 90 tablet 12     Review of Systems  Constitutional:  Positive for fever.  HENT:  Positive for congestion.   Respiratory:  Positive for cough.   All other systems reviewed and are negative. Physical Exam   Blood pressure 116/76, pulse (!) 101, temperature 98.5 F (36.9 C), temperature source Oral, resp. rate 20, height 5' 8.5" (1.74 m), weight 118.6 kg, last menstrual period 08/30/2020, SpO2 99 %, currently breastfeeding.  Physical Exam Vitals and nursing note reviewed. Exam conducted with a chaperone present.  Constitutional:      Appearance: Normal appearance. She is not ill-appearing.  Eyes:     Conjunctiva/sclera: Conjunctivae normal.  Cardiovascular:     Rate and Rhythm: Regular rhythm.     Pulses: Normal pulses.  Heart sounds: Normal heart sounds.  Pulmonary:     Effort: Pulmonary effort is normal.  Neurological:     Mental Status: She is alert.  Psychiatric:        Mood and Affect: Mood normal.        Behavior: Behavior normal.        Thought Content: Thought content normal.        Judgment: Judgment normal.    MAU Course/MDM  Procedures  Orders Placed This Encounter  Procedures   Resp Panel by RT-PCR (Flu A&B, Covid) Nasopharyngeal Swab   Discharge patient   Patient Vitals for the past 24 hrs:  BP Temp Temp src Pulse Resp SpO2 Height Weight  01/28/21 1459 116/76 98.5 F (36.9 C) Oral (!) 101 20 99 % 5' 8.5" (1.74 m) 118.6 kg  01/28/21 1456 -- -- -- -- -- 100 % -- --   Results for orders placed or performed during the hospital encounter of 01/28/21 (from the past 24 hour(s))  Resp Panel by RT-PCR (Flu A&B, Covid) Nasopharyngeal Swab     Status: None   Collection Time: 01/28/21  3:10  PM   Specimen: Nasopharyngeal Swab; Nasopharyngeal(NP) swabs in vial transport medium  Result Value Ref Range   SARS Coronavirus 2 by RT PCR NEGATIVE NEGATIVE   Influenza A by PCR NEGATIVE NEGATIVE   Influenza B by PCR NEGATIVE NEGATIVE   Meds ordered this encounter  Medications   benzonatate (TESSALON) 100 MG capsule    Sig: Take 1 capsule (100 mg total) by mouth every 8 (eight) hours.    Dispense:  21 capsule    Refill:  0    Order Specific Question:   Supervising Provider    Answer:   Levie Heritage [[id number]]   Assessment and Plan  --25 y.o. G3P1011 at [redacted]w[redacted]d  --FHT 150 by Doppler --Neg COVID, Flu --Afebrile in MAU --No acute symptoms --Discharge home in stable condition (swabs in process) --Pt called at 1655 and notifed of swab results  [name], CNM 01/28/2021, 5:03 PM

## 2021-02-15 ENCOUNTER — Ambulatory Visit: Payer: BC Managed Care – PPO | Attending: Maternal & Fetal Medicine | Admitting: Maternal & Fetal Medicine

## 2021-02-15 ENCOUNTER — Ambulatory Visit: Payer: BC Managed Care – PPO | Attending: Obstetrics

## 2021-02-15 ENCOUNTER — Other Ambulatory Visit: Payer: Self-pay

## 2021-02-15 DIAGNOSIS — Z363 Encounter for antenatal screening for malformations: Secondary | ICD-10-CM | POA: Diagnosis not present

## 2021-02-15 DIAGNOSIS — Z3A22 22 weeks gestation of pregnancy: Secondary | ICD-10-CM | POA: Insufficient documentation

## 2021-02-15 DIAGNOSIS — O3662X0 Maternal care for excessive fetal growth, second trimester, not applicable or unspecified: Secondary | ICD-10-CM | POA: Insufficient documentation

## 2021-02-15 DIAGNOSIS — O99891 Other specified diseases and conditions complicating pregnancy: Secondary | ICD-10-CM | POA: Insufficient documentation

## 2021-02-15 DIAGNOSIS — O99012 Anemia complicating pregnancy, second trimester: Secondary | ICD-10-CM | POA: Insufficient documentation

## 2021-02-15 DIAGNOSIS — J45909 Unspecified asthma, uncomplicated: Secondary | ICD-10-CM | POA: Diagnosis not present

## 2021-02-15 DIAGNOSIS — O24410 Gestational diabetes mellitus in pregnancy, diet controlled: Secondary | ICD-10-CM | POA: Insufficient documentation

## 2021-02-15 DIAGNOSIS — O10012 Pre-existing essential hypertension complicating pregnancy, second trimester: Secondary | ICD-10-CM

## 2021-02-15 DIAGNOSIS — O99212 Obesity complicating pregnancy, second trimester: Secondary | ICD-10-CM | POA: Diagnosis not present

## 2021-02-15 DIAGNOSIS — Z3480 Encounter for supervision of other normal pregnancy, unspecified trimester: Secondary | ICD-10-CM | POA: Diagnosis not present

## 2021-02-15 DIAGNOSIS — O10912 Unspecified pre-existing hypertension complicating pregnancy, second trimester: Secondary | ICD-10-CM

## 2021-02-15 DIAGNOSIS — Z6841 Body Mass Index (BMI) 40.0 and over, adult: Secondary | ICD-10-CM

## 2021-02-15 NOTE — Progress Notes (Signed)
MFM Consultation  Cindy Fletcher is a 25 yo G3P1 who is here at 22w 1d for detailed exam and chronic hypertension.  She is seen today for a detailed ultrasound at the request of Dr. Coral Ceo.   A single intrauterine pregnancy here for a detailed anatomy due to chronic hypertension no meds. Normal anatomy with measurements consistent with dates There is good fetal movement and amniotic fluid volume Suboptimal views of the fetal anatomy were obtained secondary to fetal position.  We discussed the increased risk for preeclampsia, placental abruption and fetal growth restriction in women with chronic hypertension. She is not taking low dose ASA for preeclampsia prevention, I encouraged her to do so. I explained that serial growth exams should be performed every 4-6 weeks throughout the pregnancy. Medical therapy should be initiated if blood pressure persist >150/100's. If medical therapy is initiated we recommend weekly testing at 32 weeks. She is aware of the s/sx or preeclampsia including headache, vision changes and right upper quadrant pain.  Baseline labs of UPC, CBC, and CMP should be performed if not performed previously.   Secondly, Cindy Fletcher has a HgbA1c of 5.6 suggesting prediabetes. She is planning to take the 2hr GTT. vs check her blood sugars.  I have scheduled her to return in 4-6 weeks  I spent 30 minutes with > 50% in face to face consultation.  Novella Olive, MD.

## 2021-02-18 ENCOUNTER — Other Ambulatory Visit: Payer: Self-pay | Admitting: *Deleted

## 2021-02-18 DIAGNOSIS — R638 Other symptoms and signs concerning food and fluid intake: Secondary | ICD-10-CM

## 2021-02-18 DIAGNOSIS — Z362 Encounter for other antenatal screening follow-up: Secondary | ICD-10-CM

## 2021-02-18 DIAGNOSIS — O10912 Unspecified pre-existing hypertension complicating pregnancy, second trimester: Secondary | ICD-10-CM

## 2021-02-18 DIAGNOSIS — O24410 Gestational diabetes mellitus in pregnancy, diet controlled: Secondary | ICD-10-CM

## 2021-03-15 ENCOUNTER — Ambulatory Visit: Payer: BC Managed Care – PPO

## 2021-03-17 NOTE — L&D Delivery Note (Signed)
OB/GYN Faculty Practice Delivery Note ? ?Cindy Fletcher is a 26 y.o. G3P1011 s/p SVD at [redacted]w[redacted]d. She was admitted for IOL for uncontrolled GDM.  ? ?ROM: 7h 2m with clear fluid ?GBS Status: negative ?Maximum Maternal Temperature: 98.9 ? ?Labor Progress: ?Presented for IOL, received cytotec x3, and then SROMed. An IUPC was placed and pitocin was started and patient progressed to complete.  ? ?Delivery Date/Time: 2505 on 3/19 ?Delivery: Called to room and patient was complete and pushing. Head delivered LOA. No nuchal cord present. Upon delivery of head there was no immediate restitution. Upon inspection anterior shoulder appreciated to be behind maternal pubic bone and shoulder dystocia diagnosed. At that time McRoberts maneuver done while posterior axilla hooked and with gentle traction toward introitus posterior arm delivered and immediately after infant delivered. Infant with spontaneous cry, placed on mother's abdomen, dried and stimulated. Cord clamped x 2 after 1-minute delay, and cut by father of baby under my direct supervision. Cord blood drawn. Placenta delivered spontaneously with gentle cord traction. Fundus firm with massage and Pitocin. Labia, perineum, vagina, and cervix inspected and found to be intact. ? ?Placenta: intact, 3V cord, to L&D ?Complications: 20 second shoulder dystocia ?Lacerations: intact/none ?EBL: 100cc ?Analgesia: epidural ? ? ?Infant: female  APGARs 8,9  weight pending ? ?Warner Mccreedy, MD, MPH ?OB Fellow, Faculty Practice ?[facility] for [employer], [facility] ?06/02/2021, 4:07 AM ? ? ? ? ? ?

## 2021-04-01 ENCOUNTER — Ambulatory Visit: Payer: BC Managed Care – PPO | Attending: Maternal & Fetal Medicine | Admitting: *Deleted

## 2021-04-01 ENCOUNTER — Ambulatory Visit (HOSPITAL_BASED_OUTPATIENT_CLINIC_OR_DEPARTMENT_OTHER): Payer: BC Managed Care – PPO

## 2021-04-01 ENCOUNTER — Other Ambulatory Visit: Payer: Self-pay

## 2021-04-01 VITALS — BP 136/64 | HR 112

## 2021-04-01 DIAGNOSIS — Z3A28 28 weeks gestation of pregnancy: Secondary | ICD-10-CM | POA: Diagnosis not present

## 2021-04-01 DIAGNOSIS — R638 Other symptoms and signs concerning food and fluid intake: Secondary | ICD-10-CM

## 2021-04-01 DIAGNOSIS — Z3401 Encounter for supervision of normal first pregnancy, first trimester: Secondary | ICD-10-CM

## 2021-04-01 DIAGNOSIS — D649 Anemia, unspecified: Secondary | ICD-10-CM | POA: Insufficient documentation

## 2021-04-01 DIAGNOSIS — O24410 Gestational diabetes mellitus in pregnancy, diet controlled: Secondary | ICD-10-CM | POA: Diagnosis not present

## 2021-04-01 DIAGNOSIS — O10913 Unspecified pre-existing hypertension complicating pregnancy, third trimester: Secondary | ICD-10-CM | POA: Insufficient documentation

## 2021-04-01 DIAGNOSIS — O99013 Anemia complicating pregnancy, third trimester: Secondary | ICD-10-CM | POA: Insufficient documentation

## 2021-04-01 DIAGNOSIS — O10013 Pre-existing essential hypertension complicating pregnancy, third trimester: Secondary | ICD-10-CM | POA: Diagnosis not present

## 2021-04-01 DIAGNOSIS — Z362 Encounter for other antenatal screening follow-up: Secondary | ICD-10-CM

## 2021-04-01 DIAGNOSIS — E669 Obesity, unspecified: Secondary | ICD-10-CM | POA: Diagnosis not present

## 2021-04-01 DIAGNOSIS — O99213 Obesity complicating pregnancy, third trimester: Secondary | ICD-10-CM | POA: Diagnosis not present

## 2021-04-01 DIAGNOSIS — O10912 Unspecified pre-existing hypertension complicating pregnancy, second trimester: Secondary | ICD-10-CM

## 2021-04-01 DIAGNOSIS — O3663X0 Maternal care for excessive fetal growth, third trimester, not applicable or unspecified: Secondary | ICD-10-CM | POA: Insufficient documentation

## 2021-04-02 ENCOUNTER — Other Ambulatory Visit: Payer: Self-pay | Admitting: *Deleted

## 2021-04-02 DIAGNOSIS — Z362 Encounter for other antenatal screening follow-up: Secondary | ICD-10-CM

## 2021-04-02 DIAGNOSIS — O10919 Unspecified pre-existing hypertension complicating pregnancy, unspecified trimester: Secondary | ICD-10-CM

## 2021-04-02 DIAGNOSIS — O24410 Gestational diabetes mellitus in pregnancy, diet controlled: Secondary | ICD-10-CM

## 2021-04-03 ENCOUNTER — Encounter: Payer: Self-pay | Admitting: Advanced Practice Midwife

## 2021-04-03 DIAGNOSIS — Z8632 Personal history of gestational diabetes: Secondary | ICD-10-CM

## 2021-04-03 HISTORY — DX: Personal history of gestational diabetes: Z86.32

## 2021-04-09 ENCOUNTER — Ambulatory Visit (INDEPENDENT_AMBULATORY_CARE_PROVIDER_SITE_OTHER): Payer: BC Managed Care – PPO | Admitting: Obstetrics

## 2021-04-09 ENCOUNTER — Other Ambulatory Visit: Payer: Self-pay

## 2021-04-09 ENCOUNTER — Encounter: Payer: Self-pay | Admitting: Obstetrics

## 2021-04-09 ENCOUNTER — Other Ambulatory Visit: Payer: BC Managed Care – PPO

## 2021-04-09 VITALS — BP 143/82 | HR 105 | Wt 278.0 lb

## 2021-04-09 DIAGNOSIS — Z23 Encounter for immunization: Secondary | ICD-10-CM

## 2021-04-09 DIAGNOSIS — O0990 Supervision of high risk pregnancy, unspecified, unspecified trimester: Secondary | ICD-10-CM | POA: Diagnosis not present

## 2021-04-09 DIAGNOSIS — R030 Elevated blood-pressure reading, without diagnosis of hypertension: Secondary | ICD-10-CM

## 2021-04-09 NOTE — Addendum Note (Signed)
Addended by: Coral Ceo A on: 04/09/2021 10:42 AM   Modules accepted: Orders

## 2021-04-09 NOTE — Progress Notes (Signed)
HROB/GTT, TDap given RD, tolerated well.

## 2021-04-09 NOTE — Progress Notes (Addendum)
Subjective:  Cindy Fletcher is a 26 y.o. G3P1011 at [redacted]w[redacted]d being seen today for ongoing prenatal care.  She is currently monitored for the following issues for this low-risk pregnancy and has Miscarriage; ASCUS of cervix with negative high risk HPV; Chronic hypertension complicating or reason for care during pregnancy, third trimester; GBS (group B Streptococcus carrier), +RV culture, currently pregnant; Supervision of high risk pregnancy, antepartum; and History of gestational diabetes on their problem list.  Patient reports no complaints.  Contractions: Irritability. Vag. Bleeding: None.  Movement: Present. Denies leaking of fluid.   The following portions of the patient's history were reviewed and updated as appropriate: allergies, current medications, past family history, past medical history, past social history, past surgical history and problem list. Problem list updated.  Objective:   Vitals:   04/09/21 0825  BP: (!) 151/81  Pulse: 100  Weight: 278 lb (126.1 kg)    Fetal Status: Fetal Heart Rate (bpm): 144   Movement: Present     General:  Alert, oriented and cooperative. Patient is in no acute distress.  Skin: Skin is warm and dry. No rash noted.   Cardiovascular: Normal heart rate noted  Respiratory: Normal respiratory effort, no problems with respiration noted  Abdomen: Soft, gravid, appropriate for gestational age. Pain/Pressure: Absent     Pelvic:  Cervical exam deferred        Extremities: Normal range of motion.  Edema: None  Mental Status: Normal mood and affect. Normal behavior. Normal judgment and thought content.   Urinalysis:      Assessment and Plan:  Pregnancy: G3P1011 at [redacted]w[redacted]d  1. Supervision of high risk pregnancy, antepartum Rx: - Glucose Tolerance, 2 Hours w/1 Hour - RPR - CBC - HIV antibody (with reflex) - Tdap vaccine greater than or equal to 7yo IM  2. Elevated BP without diagnosis of hypertension - check pre-eclampsia labs.  Repeat BP in 2  days. Rx: - CBC - Protein / creatinine ratio, urine - Comprehensive metabolic panel    Preterm labor symptoms and general obstetric precautions including but not limited to vaginal bleeding, contractions, leaking of fluid and fetal movement were reviewed in detail with the patient. Please refer to After Visit Summary for other counseling recommendations.   Return in about 2 weeks (around 04/23/2021) for ROB.   Shelly Bombard, MD  04/09/21

## 2021-04-10 LAB — COMPREHENSIVE METABOLIC PANEL WITH GFR
ALT: 6 IU/L (ref 0–32)
AST: 18 IU/L (ref 0–40)
Albumin/Globulin Ratio: 1.3 (ref 1.2–2.2)
Albumin: 3.6 g/dL — ABNORMAL LOW (ref 3.9–5.0)
Alkaline Phosphatase: 100 IU/L (ref 44–121)
BUN/Creatinine Ratio: 12 (ref 9–23)
BUN: 5 mg/dL — ABNORMAL LOW (ref 6–20)
Bilirubin Total: 0.5 mg/dL (ref 0.0–1.2)
CO2: 23 mmol/L (ref 20–29)
Calcium: 8.8 mg/dL (ref 8.7–10.2)
Chloride: 101 mmol/L (ref 96–106)
Creatinine, Ser: 0.43 mg/dL — ABNORMAL LOW (ref 0.57–1.00)
Globulin, Total: 2.7 g/dL (ref 1.5–4.5)
Glucose: 98 mg/dL (ref 70–99)
Potassium: 4.2 mmol/L (ref 3.5–5.2)
Sodium: 138 mmol/L (ref 134–144)
Total Protein: 6.3 g/dL (ref 6.0–8.5)
eGFR: 138 mL/min/1.73 (ref >59)

## 2021-04-10 LAB — CBC
Hematocrit: 32.5 % — ABNORMAL LOW (ref 34.0–46.6)
Hemoglobin: 10.2 g/dL — ABNORMAL LOW (ref 11.1–15.9)
MCH: 26.5 pg — ABNORMAL LOW (ref 26.6–33.0)
MCHC: 31.4 g/dL — ABNORMAL LOW (ref 31.5–35.7)
MCV: 84 fL (ref 79–97)
Platelets: 272 10*3/uL (ref 150–450)
RBC: 3.85 x10E6/uL (ref 3.77–5.28)
RDW: 14.1 % (ref 11.7–15.4)
WBC: 8.5 10*3/uL (ref 3.4–10.8)

## 2021-04-10 LAB — GLUCOSE TOLERANCE, 2 HOURS W/ 1HR
Glucose, 1 hour: 181 mg/dL — ABNORMAL HIGH (ref 70–179)
Glucose, 2 hour: 154 mg/dL — ABNORMAL HIGH (ref 70–152)
Glucose, Fasting: 98 mg/dL — ABNORMAL HIGH (ref 70–91)

## 2021-04-10 LAB — HIV ANTIBODY (ROUTINE TESTING W REFLEX): HIV Screen 4th Generation wRfx: NONREACTIVE

## 2021-04-10 LAB — SYPHILIS: RPR W/REFLEX TO RPR TITER AND TREPONEMAL ANTIBODIES, TRADITIONAL SCREENING AND DIAGNOSIS ALGORITHM: RPR Ser Ql: NONREACTIVE

## 2021-04-11 LAB — PROTEIN / CREATININE RATIO, URINE
Creatinine, Urine: 17.7 mg/dL
Protein, Ur: <4 mg/dL

## 2021-04-12 ENCOUNTER — Encounter: Payer: Self-pay | Admitting: *Deleted

## 2021-04-12 ENCOUNTER — Other Ambulatory Visit: Payer: Self-pay | Admitting: Obstetrics

## 2021-04-12 ENCOUNTER — Other Ambulatory Visit: Payer: Self-pay | Admitting: *Deleted

## 2021-04-12 DIAGNOSIS — O99013 Anemia complicating pregnancy, third trimester: Secondary | ICD-10-CM

## 2021-04-12 DIAGNOSIS — O24410 Gestational diabetes mellitus in pregnancy, diet controlled: Secondary | ICD-10-CM

## 2021-04-12 MED ORDER — ACCU-CHEK GUIDE W/DEVICE KIT: 1.0000 | PACK | Freq: Four times a day (QID) | 0 refills | Status: DC

## 2021-04-12 MED ORDER — ACCU-CHEK GUIDE VI STRP: ORAL_STRIP | 12 refills | Status: DC

## 2021-04-12 MED ORDER — IRON POLYSACCH CMPLX-B12-FA 150-0.025-1 MG PO CAPS: 1.0000 | ORAL_CAPSULE | ORAL | 5 refills | Status: DC

## 2021-04-12 MED ORDER — ACCU-CHEK SOFTCLIX LANCETS MISC
1.0000 | Freq: Four times a day (QID) | 12 refills | Status: DC
Start: 2021-04-12 — End: 2021-06-03

## 2021-04-12 NOTE — Progress Notes (Signed)
TC to patient to notify of GDM and Anemia and of RX and blood sugar supplies and referral to educator. No answer. Left HIPPA compliant VM. MyChart message sent including education on both.

## 2021-04-15 ENCOUNTER — Ambulatory Visit: Payer: BC Managed Care – PPO | Admitting: *Deleted

## 2021-04-15 ENCOUNTER — Other Ambulatory Visit: Payer: Self-pay

## 2021-04-15 VITALS — BP 134/80 | HR 111

## 2021-04-15 DIAGNOSIS — O0990 Supervision of high risk pregnancy, unspecified, unspecified trimester: Secondary | ICD-10-CM

## 2021-04-15 NOTE — Progress Notes (Signed)
Patient was assessed and managed by nursing staff during this encounter. I have reviewed the chart and agree with the documentation and plan. I have also made any necessary editorial changes.  Catalina Antigua, MD 04/15/2021 9:38 AM

## 2021-04-15 NOTE — Progress Notes (Signed)
Subjective:  Cindy Fletcher is a 26 y.o. female here for BP check.   Hypertension ROS: taking medications as instructed, no medication side effects noted, no TIA's, no chest pain on exertion, no dyspnea on exertion, and no swelling of ankles.    Objective:  BP 134/80    Pulse (!) 111    LMP 08/30/2020 (Exact Date)   Appearance alert, well appearing, and in no distress. General exam BP noted to be well controlled today in office.    Assessment:   Blood Pressure stable and improved.   Plan:  Current treatment plan is effective, no change in therapy. Pt has next OB appt scheduled.

## 2021-04-22 ENCOUNTER — Encounter: Payer: Self-pay | Admitting: *Deleted

## 2021-04-26 ENCOUNTER — Telehealth: Payer: Self-pay

## 2021-04-26 NOTE — Telephone Encounter (Signed)
Returned call and answered question about colostrum.

## 2021-04-29 ENCOUNTER — Ambulatory Visit: Payer: BC Managed Care – PPO | Attending: Obstetrics and Gynecology

## 2021-04-29 ENCOUNTER — Other Ambulatory Visit: Payer: Self-pay | Admitting: Obstetrics and Gynecology

## 2021-04-29 ENCOUNTER — Encounter: Payer: Self-pay | Admitting: *Deleted

## 2021-04-29 ENCOUNTER — Ambulatory Visit: Payer: BC Managed Care – PPO | Attending: Obstetrics and Gynecology | Admitting: Obstetrics and Gynecology

## 2021-04-29 ENCOUNTER — Other Ambulatory Visit: Payer: Self-pay | Admitting: *Deleted

## 2021-04-29 ENCOUNTER — Ambulatory Visit: Payer: BC Managed Care – PPO | Admitting: *Deleted

## 2021-04-29 ENCOUNTER — Other Ambulatory Visit: Payer: Self-pay

## 2021-04-29 VITALS — BP 134/69 | HR 102

## 2021-04-29 DIAGNOSIS — O10913 Unspecified pre-existing hypertension complicating pregnancy, third trimester: Secondary | ICD-10-CM

## 2021-04-29 DIAGNOSIS — O24410 Gestational diabetes mellitus in pregnancy, diet controlled: Secondary | ICD-10-CM

## 2021-04-29 DIAGNOSIS — O10919 Unspecified pre-existing hypertension complicating pregnancy, unspecified trimester: Secondary | ICD-10-CM | POA: Diagnosis not present

## 2021-04-29 DIAGNOSIS — O0990 Supervision of high risk pregnancy, unspecified, unspecified trimester: Secondary | ICD-10-CM | POA: Diagnosis not present

## 2021-04-29 DIAGNOSIS — Z362 Encounter for other antenatal screening follow-up: Secondary | ICD-10-CM

## 2021-04-29 DIAGNOSIS — Z3A32 32 weeks gestation of pregnancy: Secondary | ICD-10-CM | POA: Diagnosis not present

## 2021-04-29 DIAGNOSIS — O24419 Gestational diabetes mellitus in pregnancy, unspecified control: Secondary | ICD-10-CM | POA: Diagnosis not present

## 2021-04-29 DIAGNOSIS — O10013 Pre-existing essential hypertension complicating pregnancy, third trimester: Secondary | ICD-10-CM

## 2021-04-29 DIAGNOSIS — O99213 Obesity complicating pregnancy, third trimester: Secondary | ICD-10-CM

## 2021-04-29 NOTE — Progress Notes (Signed)
Maternal-Fetal Medicine   Name: Cindy Fletcher DOB: 1995/09/25 MRN: [id number] Referring Provider: Catalina Antigua, MD  I had the pleasure of seeing C[facility] at [facility]. She is G3 P1 at 32w 4d gestation and has a new diagnosis of gestational diabetes. She reports checking fasting blood glucose levels only and they are below 90 mg/dL.  Patient has not had diabetic education. She has chronic hypertension that is well controlled without antihypertensives.  Obstetrical history significant for a term vaginal delivery in March 2022 of a female infant weighing 7 pounds and 1 ounce at birth.  Her pregnancy was complicated by gestational diabetes that was controlled on diet.  Blood pressure today at her office is 134/69 mmHg. Ultrasound On today's ultrasound, the estimated fetal weight is at the 99 percentile and the abdominal circumference measurement is at the 99 percentile.  Amniotic fluid is normal and good fetal activity seen.  Antenatal testing is reassuring.  BPP 8/8.  Gestational diabetes I explained the diagnosis of gestational diabetes.  I emphasized the importance of good blood glucose control to prevent adverse fetal or neonatal outcomes.  I discussed blood glucose normal values. I encouraged her to check her blood glucose regularly. Patient needs an appointment with our diabetic educator.  Possible complications of gestational diabetes include fetal macrosomia, shoulder dystocia and birth injuries, stillbirth (in poorly controlled diabetes) and neonatal respiratory syndrome and other complications.  In about 85% of cases, gestational diabetes is well controlled by diet alone.   Medical treatment includes oral hypoglycemics or insulin. Because of uncertain control and co-morbid condition of chronic hypertension, I recommend weekly BPP from now till delivery.  Timing of delivery: In well-controlled diabetes on diet, patient can be delivered at 33- or  40-weeks' gestation. Vaginal delivery is not contraindicated. Type 2 diabetes develops in about 25% to 40% of women with GDM. I recommend postpartum screening with 75-g glucose load at 6 to 12 weeks after delivery.  Recommendations -Continue weekly BPP till delivery. -Appointment with diabetic educator. -Timing of delivery will be based on her control of diabetes.   Thank you for consultation.  If you have any questions or concerns, please contact me the [facility].  Consultation including face-to-face (more than 50%) counseling 20 minutes.

## 2021-04-30 ENCOUNTER — Ambulatory Visit (INDEPENDENT_AMBULATORY_CARE_PROVIDER_SITE_OTHER): Payer: BC Managed Care – PPO | Admitting: Advanced Practice Midwife

## 2021-04-30 ENCOUNTER — Encounter: Payer: Self-pay | Admitting: Advanced Practice Midwife

## 2021-04-30 VITALS — BP 130/72 | HR 105 | Wt 280.0 lb

## 2021-04-30 DIAGNOSIS — R102 Pelvic and perineal pain: Secondary | ICD-10-CM | POA: Diagnosis not present

## 2021-04-30 DIAGNOSIS — O24410 Gestational diabetes mellitus in pregnancy, diet controlled: Secondary | ICD-10-CM

## 2021-04-30 DIAGNOSIS — O0990 Supervision of high risk pregnancy, unspecified, unspecified trimester: Secondary | ICD-10-CM

## 2021-04-30 DIAGNOSIS — O99013 Anemia complicating pregnancy, third trimester: Secondary | ICD-10-CM

## 2021-04-30 DIAGNOSIS — Z3A32 32 weeks gestation of pregnancy: Secondary | ICD-10-CM

## 2021-04-30 DIAGNOSIS — O10913 Unspecified pre-existing hypertension complicating pregnancy, third trimester: Secondary | ICD-10-CM

## 2021-04-30 DIAGNOSIS — O26893 Other specified pregnancy related conditions, third trimester: Secondary | ICD-10-CM

## 2021-04-30 NOTE — Progress Notes (Signed)
PRENATAL VISIT NOTE  Subjective:  Cindy Fletcher is a 26 y.o. G3P1011 at [redacted]w[redacted]d being seen today for ongoing prenatal care.  She is currently monitored for the following issues for this high-risk pregnancy and has Miscarriage; ASCUS of cervix with negative high risk HPV; Chronic hypertension complicating or reason for care during pregnancy, third trimester; GBS (group B Streptococcus carrier), +RV culture, currently pregnant; Supervision of high risk pregnancy, antepartum; and History of gestational diabetes on their problem list.  Patient reports backache and occasional contractions.  Contractions: Irritability. Vag. Bleeding: None.  Movement: Present. Denies leaking of fluid.   The following portions of the patient's history were reviewed and updated as appropriate: allergies, current medications, past family history, past medical history, past social history, past surgical history and problem list.   Objective:   Vitals:   04/30/21 0906  BP: 130/72  Pulse: (!) 105  Weight: 280 lb (127 kg)    Fetal Status: Fetal Heart Rate (bpm): 151   Movement: Present     General:  Alert, oriented and cooperative. Patient is in no acute distress.  Skin: Skin is warm and dry. No rash noted.   Cardiovascular: Normal heart rate noted  Respiratory: Normal respiratory effort, no problems with respiration noted  Abdomen: Soft, gravid, appropriate for gestational age.  Pain/Pressure: Present     Pelvic: Cervical exam deferred        Extremities: Normal range of motion.  Edema: None  Mental Status: Normal mood and affect. Normal behavior. Normal judgment and thought content.   Assessment and Plan:  Pregnancy: G3P1011 at [redacted]w[redacted]d 1. Supervision of high risk pregnancy, antepartum --Anticipatory guidance about next visits/weeks of pregnancy given.  --Next appt in 2 weeks  2. Diet controlled gestational diabetes mellitus (GDM) in third trimester --Pt only taking fasting glucose. Is unable to take  them  herself so has to wait for her s/o to be home to take them for her.  Has not been to class, is not able to go due to work schedule.  I strongly recommended class where they can demonstrate how to use the meter and review foods.  Pt reports she could take some after dinner PP values when her s/o is home.  Pt to bring fasting and as many PP as she can to next visit.   --All fasting glucose below 90 per pt  3. Anemia affecting pregnancy in third trimester --Hgb 10.2 on 1/24, no s/sx of anemia  4. [redacted] weeks gestation of pregnancy   5. Chronic hypertension in obstetric context, third trimester --Pt with CHTN dx in last pregnancy --BP wnl today, no s/sx of PEC --Discussed management of CHTN and GDM including planning for delivery with pt and questions answered.  6. Pelvic pain affecting pregnancy in third trimester, antepartum -Pt with pain at symphysis pubis and back pain with positions/walking - Culture, OB Urine - Ambulatory referral to Physical Therapy   Preterm labor symptoms and general obstetric precautions including but not limited to vaginal bleeding, contractions, leaking of fluid and fetal movement were reviewed in detail with the patient. Please refer to After Visit Summary for other counseling recommendations.   Return in about 2 weeks (around 05/14/2021).  Future Appointments  Date Time Provider [place]  05/06/2021  7:30 AM [facility] NURSE [facility] [place]  05/06/2021  7:45 AM [facility] US5 [facility]  05/13/2021  7:15 AM [facility] NURSE [facility]  05/13/2021  7:30 AM [facility] US2 [facility]  05/16/2021  8:35 AM Gavin Pound,  CNM [facility] None  05/20/2021  7:30 AM [facility] NURSE [facility]  05/20/2021  7:45 AM [facility] US5 [facility]  05/27/2021  7:15 AM [facility] NURSE [facility]  05/27/2021  7:30 AM [facility] US3 [facility]    Fatima Blank, CNM

## 2021-05-02 LAB — CULTURE, OB URINE

## 2021-05-02 LAB — URINE CULTURE, OB REFLEX

## 2021-05-06 ENCOUNTER — Ambulatory Visit: Payer: BC Managed Care – PPO | Attending: Obstetrics and Gynecology

## 2021-05-06 ENCOUNTER — Other Ambulatory Visit: Payer: Self-pay

## 2021-05-06 ENCOUNTER — Encounter: Payer: Self-pay | Admitting: *Deleted

## 2021-05-06 ENCOUNTER — Ambulatory Visit: Payer: BC Managed Care – PPO | Admitting: *Deleted

## 2021-05-06 VITALS — BP 126/75 | HR 98

## 2021-05-06 DIAGNOSIS — O24410 Gestational diabetes mellitus in pregnancy, diet controlled: Secondary | ICD-10-CM | POA: Diagnosis not present

## 2021-05-06 DIAGNOSIS — E669 Obesity, unspecified: Secondary | ICD-10-CM

## 2021-05-06 DIAGNOSIS — Z3A33 33 weeks gestation of pregnancy: Secondary | ICD-10-CM

## 2021-05-06 DIAGNOSIS — O10013 Pre-existing essential hypertension complicating pregnancy, third trimester: Secondary | ICD-10-CM | POA: Diagnosis not present

## 2021-05-06 DIAGNOSIS — O10913 Unspecified pre-existing hypertension complicating pregnancy, third trimester: Secondary | ICD-10-CM | POA: Insufficient documentation

## 2021-05-06 DIAGNOSIS — O0990 Supervision of high risk pregnancy, unspecified, unspecified trimester: Secondary | ICD-10-CM

## 2021-05-06 DIAGNOSIS — O99213 Obesity complicating pregnancy, third trimester: Secondary | ICD-10-CM | POA: Diagnosis not present

## 2021-05-09 ENCOUNTER — Other Ambulatory Visit: Payer: Self-pay

## 2021-05-09 ENCOUNTER — Telehealth: Payer: Self-pay | Admitting: *Deleted

## 2021-05-09 ENCOUNTER — Inpatient Hospital Stay (HOSPITAL_COMMUNITY)
Admission: AD | Admit: 2021-05-09 | Discharge: 2021-05-09 | Disposition: A | Discharge status: Home / Self Care | Payer: BC Managed Care – PPO | Attending: Obstetrics and Gynecology | Admitting: Obstetrics and Gynecology

## 2021-05-09 ENCOUNTER — Encounter (HOSPITAL_COMMUNITY): Payer: Self-pay | Admitting: Obstetrics & Gynecology

## 2021-05-09 DIAGNOSIS — O368130 Decreased fetal movements, third trimester, not applicable or unspecified: Secondary | ICD-10-CM | POA: Insufficient documentation

## 2021-05-09 DIAGNOSIS — Z3A34 34 weeks gestation of pregnancy: Secondary | ICD-10-CM | POA: Diagnosis not present

## 2021-05-09 DIAGNOSIS — Z3689 Encounter for other specified antenatal screening: Secondary | ICD-10-CM | POA: Diagnosis not present

## 2021-05-09 DIAGNOSIS — O10913 Unspecified pre-existing hypertension complicating pregnancy, third trimester: Secondary | ICD-10-CM | POA: Diagnosis not present

## 2021-05-09 LAB — COMPREHENSIVE METABOLIC PANEL
ALT: 12 U/L (ref 0–44)
AST: 20 U/L (ref 15–41)
Albumin: 2.8 g/dL — ABNORMAL LOW (ref 3.5–5.0)
Alkaline Phosphatase: 114 U/L (ref 38–126)
Anion gap: 7 (ref 5–15)
BUN: 6 mg/dL (ref 6–20)
CO2: 23 mmol/L (ref 22–32)
Calcium: 8.6 mg/dL — ABNORMAL LOW (ref 8.9–10.3)
Chloride: 103 mmol/L (ref 98–111)
Creatinine, Ser: 0.5 mg/dL (ref 0.44–1.00)
GFR, Estimated: >60 mL/min (ref >60)
Glucose, Bld: 97 mg/dL (ref 70–99)
Potassium: 3.5 mmol/L (ref 3.5–5.1)
Sodium: 133 mmol/L — ABNORMAL LOW (ref 135–145)
Total Bilirubin: 0.8 mg/dL (ref 0.3–1.2)
Total Protein: 6.2 g/dL — ABNORMAL LOW (ref 6.5–8.1)

## 2021-05-09 LAB — CBC
HCT: 31.4 % — ABNORMAL LOW (ref 36.0–46.0)
Hemoglobin: 10.2 g/dL — ABNORMAL LOW (ref 12.0–15.0)
MCH: 26.7 pg (ref 26.0–34.0)
MCHC: 32.5 g/dL (ref 30.0–36.0)
MCV: 82.2 fL (ref 80.0–100.0)
Platelets: 257 10*3/uL (ref 150–400)
RBC: 3.82 MIL/uL — ABNORMAL LOW (ref 3.87–5.11)
RDW: 15.3 % (ref 11.5–15.5)
WBC: 8.1 10*3/uL (ref 4.0–10.5)
nRBC: 0 % (ref 0.0–0.2)

## 2021-05-09 LAB — URINALYSIS, ROUTINE W REFLEX MICROSCOPIC
Bilirubin Urine: NEGATIVE
Glucose, UA: NEGATIVE mg/dL
Hgb urine dipstick: NEGATIVE
Ketones, ur: NEGATIVE mg/dL
Leukocytes,Ua: NEGATIVE
Nitrite: NEGATIVE
Protein, ur: NEGATIVE mg/dL
Specific Gravity, Urine: 1.016 (ref 1.005–1.030)
pH: 6 (ref 5.0–8.0)

## 2021-05-09 LAB — PROTEIN / CREATININE RATIO, URINE
Creatinine, Urine: 139.02 mg/dL
Protein Creatinine Ratio: 0.17 mg/mg{Cre} — ABNORMAL HIGH (ref 0.00–0.15)
Total Protein, Urine: 24 mg/dL

## 2021-05-09 NOTE — MAU Note (Signed)
...  Cindy Fletcher is a 26 y.o. at [redacted]w[redacted]d here in MAU reporting: DFM since 0600 this morning. Elevated BP at home in the 150's/90's. Denies visual disturbances, HA, epigastric/RUQ, and edema. She states she has felt the baby move a total of around 6 times. No VB or LOF. Has been experiencing occasional lower abdominal pain, last felt two hours ago. Not feeling pain now.   Pain score: Denies current pain.  FHT: 160 initial external Lab orders placed from triage: UA

## 2021-05-09 NOTE — MAU Provider Note (Signed)
History     [id number]  Arrival date and time: 05/09/21 1744    Chief Complaint  Patient presents with   Hypertension   Decreased Fetal Movement     HPI Cindy Fletcher is a 26 y.o. at 110w0dby early ultrasound with PMHx notable for chronic hypertension & gestational diabetes, who presents for hypertension & decreased fetal movement. Reports elevated BP readings at home today. States she doesn't normally check her BP at home but did because she "didn't feel right" and her BP was 150s/90s. Denies headache, visual disturbance, or epigastric pain. She is not on antihypertensives.  Reports decreased fetal movement since this morning. States she's only felt the baby move 6 times today. Felt some lower abdominal cramping yesterday and a few hours ago but that has resolved without intervention. Denies LOF or vaginal bleeding.    OB History     Gravida  3   Para  1   Term  1   Preterm      AB  1   Living  1      SAB  1   IAB      Ectopic      Multiple  0   Live Births  1           Past Medical History:  Diagnosis Date   Anemia    Asthma    Gestational diabetes    Hypertension     Past Surgical History:  Procedure Laterality Date   NO PAST SURGERIES      Family History  Problem Relation Age of Onset   Diabetes Mother    ADD / ADHD Father    Asthma Father    ADD / ADHD Sister    Hypertension Paternal Aunt    Diabetes Maternal Grandmother    Hypertension Maternal Grandmother    ADD / ADHD Paternal Grandmother    Hypertension Paternal Grandmother    ADD / ADHD Paternal Grandfather    Asthma Paternal Grandfather    Hypertension Paternal Grandfather     No Known Allergies  No current facility-administered medications on file prior to encounter.   Current Outpatient Medications on File Prior to Encounter  Medication Sig Dispense Refill   Accu-Chek Softclix Lancets lancets 1 each by Other route 4 (four) times daily. 100 each 12   Blood  Glucose Monitoring Suppl (ACCU-CHEK GUIDE) w/Device KIT 1 Device by Does not apply route 4 (four) times daily. 1 kit 0   glucose blood (ACCU-CHEK GUIDE) test strip Use to check blood sugars four times a day was instructed 50 each 12   aspirin 81 MG chewable tablet Chew by mouth daily.     Iron Polysacch Cmplx-B12-FA 150-0.025-1 MG CAPS Take 1 capsule by mouth every other day. 30 capsule 5   Prenatal Vit-Fe Phos-FA-Omega (VITAFOL GUMMIES) 3.33-0.333-34.8 MG CHEW Chew 3 each by mouth daily. 90 tablet 12     ROS Pertinent positives and negative per HPI, all others reviewed and negative  Physical Exam   BP (!) 152/87    Pulse 89    Temp 98.6 F (37 C) (Oral)    Resp 15    Ht 5' 8.5" (1.74 m)    Wt 126.6 kg    LMP 08/30/2020 (Exact Date)    SpO2 97%    BMI 41.80 kg/m   Patient Vitals for the past 24 hrs:  BP Temp Temp src Pulse Resp SpO2 Height Weight  05/09/21 2031 (!) 152/87 -- -- 89 -- -- -- --  05/09/21 2016 (!) 143/84 -- -- 97 -- -- -- --  05/09/21 2000 126/64 -- -- 95 -- 97 % -- --  05/09/21 1946 140/74 -- -- (!) 104 -- -- -- --  05/09/21 1931 125/74 -- -- 88 -- -- -- --  05/09/21 1916 (!) 106/55 -- -- 98 -- -- -- --  05/09/21 1900 123/71 -- -- (!) 111 -- 97 % -- --  05/09/21 1833 121/72 -- -- (!) 104 -- 97 % -- --  05/09/21 1802 138/77 98.6 F (37 C) Oral (!) 106 15 100 % -- --  05/09/21 1801 -- -- -- -- -- -- 5' 8.5" (1.74 m) 126.6 kg    Physical Exam Vitals and nursing note reviewed.  Constitutional:      General: She is not in acute distress.    Appearance: She is well-developed.  Eyes:     General: No scleral icterus.    Extraocular Movements: Extraocular movements intact.     Pupils: Pupils are equal, round, and reactive to light.  Pulmonary:     Effort: Pulmonary effort is normal. No respiratory distress.  Musculoskeletal:     Right lower leg: 1+ Pitting Edema present.     Left lower leg: 1+ Pitting Edema present.  Skin:    General: Skin is warm and dry.   Neurological:     Mental Status: She is alert.     Deep Tendon Reflexes:     Reflex Scores:      Patellar reflexes are 2+ on the right side and 2+ on the left side.    Comments: No clonus  Psychiatric:        Mood and Affect: Mood normal.        Behavior: Behavior normal.       FHT Baseline 145, moderate variability, 15x15 accels, no decels Toco: none Cat: 1  Labs Results for orders placed or performed during the hospital encounter of 05/09/21 (from the past 24 hour(s))  Urinalysis, Routine w reflex microscopic Urine, Clean Catch     Status: Abnormal   Collection Time: 05/09/21  6:46 PM  Result Value Ref Range   Color, Urine YELLOW YELLOW   APPearance HAZY (A) CLEAR   Specific Gravity, Urine 1.016 1.005 - 1.030   pH 6.0 5.0 - 8.0   Glucose, UA NEGATIVE NEGATIVE mg/dL   Hgb urine dipstick NEGATIVE NEGATIVE   Bilirubin Urine NEGATIVE NEGATIVE   Ketones, ur NEGATIVE NEGATIVE mg/dL   Protein, ur NEGATIVE NEGATIVE mg/dL   Nitrite NEGATIVE NEGATIVE   Leukocytes,Ua NEGATIVE NEGATIVE  Protein / creatinine ratio, urine     Status: Abnormal   Collection Time: 05/09/21  6:46 PM  Result Value Ref Range   Creatinine, Urine 139.02 mg/dL   Total Protein, Urine 24 mg/dL   Protein Creatinine Ratio 0.17 (H) 0.00 - 0.15 mg/mg[Cre]  CBC     Status: Abnormal   Collection Time: 05/09/21  7:15 PM  Result Value Ref Range   WBC 8.1 4.0 - 10.5 K/uL   RBC 3.82 (L) 3.87 - 5.11 MIL/uL   Hemoglobin 10.2 (L) 12.0 - 15.0 g/dL   HCT 31.4 (L) 36.0 - 46.0 %   MCV 82.2 80.0 - 100.0 fL   MCH 26.7 26.0 - 34.0 pg   MCHC 32.5 30.0 - 36.0 g/dL   RDW 15.3 11.5 - 15.5 %   Platelets 257 150 - 400 K/uL   nRBC 0.0 0.0 - 0.2 %  Comprehensive metabolic panel     Status:  Abnormal   Collection Time: 05/09/21  7:15 PM  Result Value Ref Range   Sodium 133 (L) 135 - 145 mmol/L   Potassium 3.5 3.5 - 5.1 mmol/L   Chloride 103 98 - 111 mmol/L   CO2 23 22 - 32 mmol/L   Glucose, Bld 97 70 - 99 mg/dL   BUN 6 6 -  20 mg/dL   Creatinine, Ser 0.50 0.44 - 1.00 mg/dL   Calcium 8.6 (L) 8.9 - 10.3 mg/dL   Total Protein 6.2 (L) 6.5 - 8.1 g/dL   Albumin 2.8 (L) 3.5 - 5.0 g/dL   AST 20 15 - 41 U/L   ALT 12 0 - 44 U/L   Alkaline Phosphatase 114 38 - 126 U/L   Total Bilirubin 0.8 0.3 - 1.2 mg/dL   GFR, Estimated >60 >60 mL/min   Anion gap 7 5 - 15    Imaging No results found.  MAU Course  Procedures Lab Orders         Urinalysis, Routine w reflex microscopic Urine, Clean Catch         CBC         Comprehensive metabolic panel         Protein / creatinine ratio, urine    No orders of the defined types were placed in this encounter.  Imaging Orders  No imaging studies ordered today    MDM Fetal tracing reactive. Patient documented over 30 minutes during her less than 2 hours of fetal monitoring.   Mostly normotensive in MAU. No severe range BPs. Asymptomatic. Preeclampsia labs ordered & reviewed Assessment and Plan   1. Chronic hypertension complicating or reason for care during pregnancy, third trimester  -Mostly normotensive in MAU. Normal preeclampsia labs. Reviewed with Dr. Damita Dunnings - recommends daily BP log for patient to take to her next visit -reviewed preeclampsia precautions  2. Decreased fetal movements in third trimester, single or unspecified fetus  -reactive NST with normal movement perception in MAU. Discussed fetal kick count form  3. NST (non-stress test) reactive   4. [redacted] weeks gestation of pregnancy      Jorje Guild, NP 05/09/21 8:42 PM

## 2021-05-09 NOTE — Telephone Encounter (Signed)
Received communication for [employer] regarding elevated BP reading 153/91 and report of abdominal pain for this patient. TC to patient. Pt reports BP as above and repeat 140s/80s. Also reports lower abdominal cramping, "feeling off today", and decreased fetal movement. Patient advised to seek care in MAU immediately for further evaluation of BP, rule out pre eclampsia, assess for UCs, and assess fetal wellbeing. Instructed to go to [facility], Entrance C, MAU first floor.

## 2021-05-09 NOTE — Discharge Instructions (Signed)
Check blood pressure daily. Keep log of blood pressure readings to take to your next prenatal visit. Call office for any blood pressure readings that are 160/110 or higher.

## 2021-05-13 ENCOUNTER — Ambulatory Visit: Payer: BC Managed Care – PPO | Attending: Obstetrics and Gynecology

## 2021-05-13 ENCOUNTER — Other Ambulatory Visit: Payer: Self-pay

## 2021-05-13 ENCOUNTER — Ambulatory Visit: Payer: BC Managed Care – PPO | Admitting: *Deleted

## 2021-05-13 VITALS — BP 135/63 | HR 91

## 2021-05-13 DIAGNOSIS — O10013 Pre-existing essential hypertension complicating pregnancy, third trimester: Secondary | ICD-10-CM

## 2021-05-13 DIAGNOSIS — O0990 Supervision of high risk pregnancy, unspecified, unspecified trimester: Secondary | ICD-10-CM | POA: Diagnosis not present

## 2021-05-13 DIAGNOSIS — O10913 Unspecified pre-existing hypertension complicating pregnancy, third trimester: Secondary | ICD-10-CM | POA: Insufficient documentation

## 2021-05-13 DIAGNOSIS — O24410 Gestational diabetes mellitus in pregnancy, diet controlled: Secondary | ICD-10-CM

## 2021-05-13 DIAGNOSIS — Z3A34 34 weeks gestation of pregnancy: Secondary | ICD-10-CM | POA: Diagnosis not present

## 2021-05-16 ENCOUNTER — Ambulatory Visit (INDEPENDENT_AMBULATORY_CARE_PROVIDER_SITE_OTHER): Payer: BC Managed Care – PPO

## 2021-05-16 ENCOUNTER — Other Ambulatory Visit: Payer: Self-pay

## 2021-05-16 VITALS — BP 136/81 | HR 108 | Wt 281.6 lb

## 2021-05-16 DIAGNOSIS — O0990 Supervision of high risk pregnancy, unspecified, unspecified trimester: Secondary | ICD-10-CM

## 2021-05-16 DIAGNOSIS — O10913 Unspecified pre-existing hypertension complicating pregnancy, third trimester: Secondary | ICD-10-CM

## 2021-05-16 DIAGNOSIS — O24410 Gestational diabetes mellitus in pregnancy, diet controlled: Secondary | ICD-10-CM

## 2021-05-16 DIAGNOSIS — Z3A35 35 weeks gestation of pregnancy: Secondary | ICD-10-CM

## 2021-05-16 NOTE — Progress Notes (Signed)
Pt presents for ROB visit. She has no concerns today.  ?

## 2021-05-16 NOTE — Progress Notes (Signed)
? ?  HIGH-RISK PREGNANCY OFFICE VISIT ? ?Patient name: [name] MRN [id number]  Date of birth: [birth year] ?Chief Complaint:   ?Routine Prenatal Visit ? ?Subjective:   ?Cindy Fletcher is a 26 y.o. G36P1011 female at [redacted]w[redacted]d with an Estimated Date of Delivery: 06/20/21 being seen today for ongoing management of a high-risk pregnancy aeb has Miscarriage; ASCUS of cervix with negative high risk HPV; Chronic hypertension complicating or reason for care during pregnancy, third trimester; GBS (group B Streptococcus carrier), +RV culture, currently pregnant; Supervision of high risk pregnancy, antepartum; and History of gestational diabetes on their problem list. ? ?Patient presents today with, FOB, no complaints.  Patient endorses fetal movement. Patient reports abdominal cramping, in her lower stomach 1-2x/day.  She reports using support belt with minimal relief.  She denies contractions.  Patient denies vaginal concerns including abnormal discharge, leaking of fluid, and bleeding.  Contractions: Irritability. Vag. Bleeding: None.  Movement: Present. ? ?Reviewed past medical,surgical, social, obstetrical and family history as well as problem list, medications and allergies. ? ?Objective  ? ?Vitals:  ? 05/16/21 0838  ?BP: 136/81  ?Pulse: (!) 108  ?Weight: 281 lb 9.6 oz (127.7 kg)  ?Body mass index is 42.19 kg/m?.  ?Total Weight Gain:36 lb 9.6 oz (16.6 kg) ? ?  ?     Physical Examination:  ? General appearance: Well appearing, and in no distress ? Mental status: Alert, oriented to person, place, and time ? Skin: Warm & dry ? Cardiovascular: Normal heart rate noted ? Respiratory: Normal respiratory effort, no distress ? Abdomen: Soft, gravid, nontender, LGA with Fundal Height: 44 cm ? Pelvic: Cervical exam deferred          ? Extremities: Edema: Trace ? ?Fetal Status: Fetal Heart Rate (bpm): 146  Movement: Present  ? ?No results found for this or any previous visit (from the past 24 hour(s)).  ?Assessment &  Plan:  ?High-risk pregnancy of a 26 y.o., G3P1011 at [redacted]w[redacted]d with an Estimated Date of Delivery: 06/20/21  ? ?1. Supervision of high risk pregnancy, antepartum ?-Anticipatory guidance for upcoming appts. ?-Patient to schedule next appt in 1 weeks for an in-person visit. ? ? ?2. Diet controlled gestational diabetes mellitus (GDM) in third trimester ?-Did not bring logs reports: ?*Under 100" in the morning. ?*States sometimes eats in middle of night ?*After lunch between 102-110 ?*Dinner 110 highest ?-Instructed to bring logs to appts for provider review and more precise documentation of values.  ? ?3. Chronic hypertension in obstetric context, third trimester ?-BP normotensive  ?- ? ?4. [redacted] weeks gestation of pregnancy ?-Doing well. ?-Discussed potential for IOl at 38 weeks considering history and fetal size. ?-Patient verbalizes understanding. ? ?  ?Meds: No orders of the defined types were placed in this encounter. ? ?Labs/procedures today:  ?Lab Orders  ?No laboratory test(s) ordered today  ?  ? ?Reviewed: Preterm labor symptoms and general obstetric precautions including but not limited to vaginal bleeding, contractions, leaking of fluid and fetal movement were reviewed in detail with the patient.  All questions were answered. ? ?Follow-up: Return in about 1 week (around 05/23/2021) for HROB with GBS. ? ?No orders of the defined types were placed in this encounter. ? ?Maryann Conners MSN, CNM ?05/16/2021 ? ?

## 2021-05-20 ENCOUNTER — Ambulatory Visit: Payer: BC Managed Care – PPO | Admitting: *Deleted

## 2021-05-20 ENCOUNTER — Ambulatory Visit: Payer: BC Managed Care – PPO | Attending: Obstetrics and Gynecology

## 2021-05-20 ENCOUNTER — Other Ambulatory Visit: Payer: Self-pay

## 2021-05-20 ENCOUNTER — Encounter: Payer: Self-pay | Admitting: *Deleted

## 2021-05-20 VITALS — BP 139/73 | HR 97

## 2021-05-20 DIAGNOSIS — O99213 Obesity complicating pregnancy, third trimester: Secondary | ICD-10-CM

## 2021-05-20 DIAGNOSIS — O99513 Diseases of the respiratory system complicating pregnancy, third trimester: Secondary | ICD-10-CM

## 2021-05-20 DIAGNOSIS — O09293 Supervision of pregnancy with other poor reproductive or obstetric history, third trimester: Secondary | ICD-10-CM

## 2021-05-20 DIAGNOSIS — Z3A35 35 weeks gestation of pregnancy: Secondary | ICD-10-CM

## 2021-05-20 DIAGNOSIS — O99013 Anemia complicating pregnancy, third trimester: Secondary | ICD-10-CM

## 2021-05-20 DIAGNOSIS — O3663X0 Maternal care for excessive fetal growth, third trimester, not applicable or unspecified: Secondary | ICD-10-CM | POA: Diagnosis not present

## 2021-05-20 DIAGNOSIS — O0990 Supervision of high risk pregnancy, unspecified, unspecified trimester: Secondary | ICD-10-CM | POA: Diagnosis not present

## 2021-05-20 DIAGNOSIS — O10013 Pre-existing essential hypertension complicating pregnancy, third trimester: Secondary | ICD-10-CM

## 2021-05-20 DIAGNOSIS — J45909 Unspecified asthma, uncomplicated: Secondary | ICD-10-CM

## 2021-05-20 DIAGNOSIS — O10913 Unspecified pre-existing hypertension complicating pregnancy, third trimester: Secondary | ICD-10-CM | POA: Insufficient documentation

## 2021-05-20 DIAGNOSIS — O24410 Gestational diabetes mellitus in pregnancy, diet controlled: Secondary | ICD-10-CM | POA: Insufficient documentation

## 2021-05-24 ENCOUNTER — Ambulatory Visit (INDEPENDENT_AMBULATORY_CARE_PROVIDER_SITE_OTHER): Payer: BC Managed Care – PPO | Admitting: Certified Nurse Midwife

## 2021-05-24 ENCOUNTER — Other Ambulatory Visit (HOSPITAL_COMMUNITY)
Admission: RE | Admit: 2021-05-24 | Discharge: 2021-05-24 | Disposition: A | Discharge status: Home / Self Care | Payer: BC Managed Care – PPO | Source: Ambulatory Visit | Attending: Certified Nurse Midwife | Admitting: Certified Nurse Midwife

## 2021-05-24 ENCOUNTER — Other Ambulatory Visit: Payer: Self-pay

## 2021-05-24 ENCOUNTER — Telehealth (HOSPITAL_COMMUNITY): Payer: Self-pay | Admitting: *Deleted

## 2021-05-24 ENCOUNTER — Other Ambulatory Visit: Payer: Self-pay | Admitting: Advanced Practice Midwife

## 2021-05-24 VITALS — BP 131/83 | HR 103 | Wt 283.6 lb

## 2021-05-24 DIAGNOSIS — O10913 Unspecified pre-existing hypertension complicating pregnancy, third trimester: Secondary | ICD-10-CM | POA: Diagnosis not present

## 2021-05-24 DIAGNOSIS — Z3A36 36 weeks gestation of pregnancy: Secondary | ICD-10-CM | POA: Diagnosis not present

## 2021-05-24 DIAGNOSIS — Z22330 Carrier of Group B streptococcus: Secondary | ICD-10-CM

## 2021-05-24 DIAGNOSIS — O0993 Supervision of high risk pregnancy, unspecified, third trimester: Secondary | ICD-10-CM | POA: Insufficient documentation

## 2021-05-24 DIAGNOSIS — O0990 Supervision of high risk pregnancy, unspecified, unspecified trimester: Secondary | ICD-10-CM

## 2021-05-24 DIAGNOSIS — Z3493 Encounter for supervision of normal pregnancy, unspecified, third trimester: Secondary | ICD-10-CM

## 2021-05-24 DIAGNOSIS — Z3685 Encounter for antenatal screening for Streptococcus B: Secondary | ICD-10-CM | POA: Diagnosis not present

## 2021-05-24 DIAGNOSIS — O24410 Gestational diabetes mellitus in pregnancy, diet controlled: Secondary | ICD-10-CM | POA: Diagnosis not present

## 2021-05-24 LAB — OB RESULTS CONSOLE GC/CHLAMYDIA: Gonorrhea: NEGATIVE

## 2021-05-24 NOTE — Progress Notes (Signed)
Pt presents for ROB @ 36.1 weeks. No concerns today. GBS, GC/CC collected today. Has questions about induction.  ?GAD7=12 ?

## 2021-05-24 NOTE — Progress Notes (Signed)
? ?  PRENATAL VISIT NOTE ? ?Subjective:  ?Cindy Fletcher is a 26 y.o. G3P1011 at [redacted]w[redacted]d being seen today for ongoing prenatal care.  She is currently monitored for the following issues for this high-risk pregnancy and has Miscarriage; ASCUS of cervix with negative high risk HPV; Chronic hypertension complicating or reason for care during pregnancy, third trimester; GBS (group B Streptococcus carrier), +RV culture, currently pregnant; Supervision of high risk pregnancy, antepartum; and History of gestational diabetes on their problem list. ? ?Patient reports  lots of pelvic pressure .  Contractions: Irritability. Vag. Bleeding: None.  Movement: Present. Denies leaking of fluid.  ? ?The following portions of the patient's history were reviewed and updated as appropriate: allergies, current medications, past family history, past medical history, past social history, past surgical history and problem list.  ? ?Objective:  ? ?Vitals:  ? 05/24/21 0926  ?BP: 131/83  ?Pulse: (!) 103  ?Weight: 283 lb 9.6 oz (128.6 kg)  ? ?Fetal Status: Fetal Heart Rate (bpm): 146 Fundal Height: 47 cm Movement: Present  Presentation: Vertex ? ?General:  Alert, oriented and cooperative. Patient is in no acute distress.  ?Skin: Skin is warm and dry. No rash noted.   ?Cardiovascular: Normal heart rate noted  ?Respiratory: Normal respiratory effort, no problems with respiration noted  ?Abdomen: Soft, gravid, appropriate for gestational age.  Pain/Pressure: Present     ?Pelvic: Cervical exam deferred        ?Extremities: Normal range of motion.  Edema: Trace  ?Mental Status: Normal mood and affect. Normal behavior. Normal judgment and thought content.  ? ?Assessment and Plan:  ?Pregnancy: G3P1011 at [redacted]w[redacted]d ?1. Supervision of low-risk pregnancy, third trimester ?- Doing well overall, still feeling plenty of fetal movement just feels huge and has a lot of pressure ? ?2. [redacted] weeks gestation of pregnancy ?- Routine OB care ?- Cervicovaginal  ancillary only( [place]) ? ?3. GBS carrier ?- Culture, beta strep (group b only) - last positive screening was during her previous pregnancy ? ?4. Chronic hypertension complicating or reason for care during pregnancy, third trimester ?- BP stable, no s/sx of PEC ? ?5. Diet controlled gestational diabetes in third trimester ?- Glucose log showed normal post-prandial, all fastings between 96-105, most over 99. Pt thought they were supposed to be under 100. ?- Discussed MFM recommendation and elevated fastings with Dr. Harolyn Rutherford who agreed pt should be induced at 37 weeks. Pt relieved by this news and eager for IOL. ?- Checked IOL list and scheduled for 06/01/21 at [facility]. Orders placed. ? ?Preterm labor symptoms and general obstetric precautions including but not limited to vaginal bleeding, contractions, leaking of fluid and fetal movement were reviewed in detail with the patient. ?Please refer to After Visit Summary for other counseling recommendations.  ? ?Return in about 7 weeks (around 07/12/2021) for PP with GTT. ? ?Future Appointments  ?Date Time Provider [place]  ?05/27/2021  7:15 AM [facility] NURSE [facility]  ?05/27/2021  7:30 AM [facility] US3 [facility]  ?06/01/2021 12:00 AM [facility] [place] [facility] None  ? ? ?Gabriel Carina, CNM ? ?

## 2021-05-24 NOTE — Telephone Encounter (Signed)
Preadmission screen  

## 2021-05-27 ENCOUNTER — Other Ambulatory Visit: Payer: Self-pay

## 2021-05-27 ENCOUNTER — Ambulatory Visit: Payer: BC Managed Care – PPO | Attending: Obstetrics and Gynecology

## 2021-05-27 ENCOUNTER — Ambulatory Visit: Payer: BC Managed Care – PPO

## 2021-05-27 ENCOUNTER — Encounter (HOSPITAL_COMMUNITY): Payer: Self-pay | Admitting: *Deleted

## 2021-05-27 ENCOUNTER — Telehealth (HOSPITAL_COMMUNITY): Payer: Self-pay | Admitting: *Deleted

## 2021-05-27 LAB — CERVICOVAGINAL ANCILLARY ONLY
Chlamydia: NEGATIVE
Comment: NEGATIVE
Comment: NORMAL
Neisseria Gonorrhea: NEGATIVE

## 2021-05-27 NOTE — Telephone Encounter (Signed)
Preadmission screen  

## 2021-05-28 LAB — CULTURE, BETA STREP (GROUP B ONLY): Strep Gp B Culture: NEGATIVE

## 2021-05-31 ENCOUNTER — Encounter (HOSPITAL_COMMUNITY): Payer: Self-pay | Admitting: Family Medicine

## 2021-06-01 ENCOUNTER — Inpatient Hospital Stay (HOSPITAL_COMMUNITY): Payer: BC Managed Care – PPO | Admitting: Anesthesiology

## 2021-06-01 ENCOUNTER — Inpatient Hospital Stay (HOSPITAL_COMMUNITY): Payer: BC Managed Care – PPO

## 2021-06-01 ENCOUNTER — Encounter (HOSPITAL_COMMUNITY): Payer: Self-pay | Admitting: Family Medicine

## 2021-06-01 ENCOUNTER — Inpatient Hospital Stay (HOSPITAL_COMMUNITY)
Admission: AD | Admit: 2021-06-01 | Discharge: 2021-06-03 | DRG: 806 | Disposition: A | Discharge status: Home / Self Care | Payer: BC Managed Care – PPO | Attending: Family Medicine | Admitting: Family Medicine

## 2021-06-01 ENCOUNTER — Other Ambulatory Visit: Payer: Self-pay

## 2021-06-01 DIAGNOSIS — O3663X0 Maternal care for excessive fetal growth, third trimester, not applicable or unspecified: Secondary | ICD-10-CM | POA: Diagnosis present

## 2021-06-01 DIAGNOSIS — O1002 Pre-existing essential hypertension complicating childbirth: Secondary | ICD-10-CM | POA: Diagnosis present

## 2021-06-01 DIAGNOSIS — Z8632 Personal history of gestational diabetes: Secondary | ICD-10-CM | POA: Diagnosis present

## 2021-06-01 DIAGNOSIS — O99824 Streptococcus B carrier state complicating childbirth: Secondary | ICD-10-CM | POA: Diagnosis not present

## 2021-06-01 DIAGNOSIS — Z3A37 37 weeks gestation of pregnancy: Secondary | ICD-10-CM | POA: Diagnosis not present

## 2021-06-01 DIAGNOSIS — R87610 Atypical squamous cells of undetermined significance on cytologic smear of cervix (ASC-US): Secondary | ICD-10-CM | POA: Diagnosis present

## 2021-06-01 DIAGNOSIS — O24429 Gestational diabetes mellitus in childbirth, unspecified control: Secondary | ICD-10-CM | POA: Diagnosis not present

## 2021-06-01 DIAGNOSIS — O99820 Streptococcus B carrier state complicating pregnancy: Secondary | ICD-10-CM

## 2021-06-01 DIAGNOSIS — O24420 Gestational diabetes mellitus in childbirth, diet controlled: Secondary | ICD-10-CM | POA: Diagnosis not present

## 2021-06-01 DIAGNOSIS — O164 Unspecified maternal hypertension, complicating childbirth: Secondary | ICD-10-CM | POA: Diagnosis not present

## 2021-06-01 DIAGNOSIS — O0990 Supervision of high risk pregnancy, unspecified, unspecified trimester: Secondary | ICD-10-CM

## 2021-06-01 DIAGNOSIS — Z7982 Long term (current) use of aspirin: Secondary | ICD-10-CM | POA: Diagnosis not present

## 2021-06-01 DIAGNOSIS — O10913 Unspecified pre-existing hypertension complicating pregnancy, third trimester: Secondary | ICD-10-CM | POA: Diagnosis present

## 2021-06-01 DIAGNOSIS — O3663X1 Maternal care for excessive fetal growth, third trimester, fetus 1: Secondary | ICD-10-CM | POA: Diagnosis not present

## 2021-06-01 DIAGNOSIS — O1092 Unspecified pre-existing hypertension complicating childbirth: Secondary | ICD-10-CM | POA: Diagnosis not present

## 2021-06-01 HISTORY — DX: Unspecified abnormal cytological findings in specimens from vagina: R87.629

## 2021-06-01 LAB — CBC
HCT: 32.6 % — ABNORMAL LOW (ref 36.0–46.0)
HCT: 33.4 % — ABNORMAL LOW (ref 36.0–46.0)
Hemoglobin: 10.5 g/dL — ABNORMAL LOW (ref 12.0–15.0)
Hemoglobin: 10.6 g/dL — ABNORMAL LOW (ref 12.0–15.0)
MCH: 26.5 pg (ref 26.0–34.0)
MCH: 26.2 pg (ref 26.0–34.0)
MCHC: 32.2 g/dL (ref 30.0–36.0)
MCHC: 31.7 g/dL (ref 30.0–36.0)
MCV: 82.3 fL (ref 80.0–100.0)
MCV: 82.7 fL (ref 80.0–100.0)
Platelets: 247 10*3/uL (ref 150–400)
Platelets: 229 10*3/uL (ref 150–400)
RBC: 3.96 MIL/uL (ref 3.87–5.11)
RBC: 4.04 MIL/uL (ref 3.87–5.11)
RDW: 15.7 % — ABNORMAL HIGH (ref 11.5–15.5)
RDW: 15.9 % — ABNORMAL HIGH (ref 11.5–15.5)
WBC: 9.1 10*3/uL (ref 4.0–10.5)
WBC: 9.3 10*3/uL (ref 4.0–10.5)
nRBC: 0 % (ref 0.0–0.2)
nRBC: 0 % (ref 0.0–0.2)

## 2021-06-01 LAB — PROTEIN / CREATININE RATIO, URINE
Creatinine, Urine: 94.02 mg/dL
Protein Creatinine Ratio: 0.16 mg/mg{Cre} — ABNORMAL HIGH (ref 0.00–0.15)
Total Protein, Urine: 15 mg/dL

## 2021-06-01 LAB — TYPE AND SCREEN
ABO/RH(D): A POS
Antibody Screen: NEGATIVE

## 2021-06-01 LAB — COMPREHENSIVE METABOLIC PANEL
ALT: 12 U/L (ref 0–44)
AST: 26 U/L (ref 15–41)
Albumin: 2.7 g/dL — ABNORMAL LOW (ref 3.5–5.0)
Alkaline Phosphatase: 152 U/L — ABNORMAL HIGH (ref 38–126)
Anion gap: 11 (ref 5–15)
BUN: 7 mg/dL (ref 6–20)
CO2: 22 mmol/L (ref 22–32)
Calcium: 8.9 mg/dL (ref 8.9–10.3)
Chloride: 103 mmol/L (ref 98–111)
Creatinine, Ser: 0.53 mg/dL (ref 0.44–1.00)
GFR, Estimated: >60 mL/min (ref >60)
Glucose, Bld: 125 mg/dL — ABNORMAL HIGH (ref 70–99)
Potassium: 3.6 mmol/L (ref 3.5–5.1)
Sodium: 136 mmol/L (ref 135–145)
Total Bilirubin: 0.8 mg/dL (ref 0.3–1.2)
Total Protein: 6 g/dL — ABNORMAL LOW (ref 6.5–8.1)

## 2021-06-01 LAB — GLUCOSE, CAPILLARY
Glucose-Capillary: 102 mg/dL — ABNORMAL HIGH (ref 70–99)
Glucose-Capillary: 98 mg/dL (ref 70–99)
Glucose-Capillary: 80 mg/dL (ref 70–99)
Glucose-Capillary: 83 mg/dL (ref 70–99)

## 2021-06-01 LAB — RPR: RPR Ser Ql: NONREACTIVE

## 2021-06-01 MED ORDER — SODIUM CHLORIDE 0.9% FLUSH
3.0000 mL | INTRAVENOUS | Status: DC | PRN
Start: 2021-06-01 — End: 2021-06-02

## 2021-06-01 MED ORDER — OXYTOCIN BOLUS FROM INFUSION
333.0000 mL | Freq: Once | INTRAVENOUS | Status: AC
  Administered 2021-06-02: 333 mL via INTRAVENOUS

## 2021-06-01 MED ORDER — FENTANYL CITRATE (PF) 100 MCG/2ML IJ SOLN
100.0000 ug | INTRAMUSCULAR | Status: DC | PRN
  Administered 2021-06-01: 100 ug via INTRAVENOUS
  Filled 2021-06-01: qty 2

## 2021-06-01 MED ORDER — PHENYLEPHRINE 40 MCG/ML (10ML) SYRINGE FOR IV PUSH (FOR BLOOD PRESSURE SUPPORT)
80.0000 ug | PREFILLED_SYRINGE | INTRAVENOUS | Status: DC | PRN
  Administered 2021-06-02: 80 ug via INTRAVENOUS

## 2021-06-01 MED ORDER — EPHEDRINE 5 MG/ML INJ: 10.0000 mg | INTRAVENOUS | Status: DC | PRN

## 2021-06-01 MED ORDER — ONDANSETRON HCL 4 MG/2ML IJ SOLN
4.0000 mg | Freq: Four times a day (QID) | INTRAMUSCULAR | Status: DC | PRN
Start: 2021-06-01 — End: 2021-06-02

## 2021-06-01 MED ORDER — TERBUTALINE SULFATE 1 MG/ML IJ SOLN: 0.2500 mg | Freq: Once | INTRAMUSCULAR | Status: DC | PRN

## 2021-06-01 MED ORDER — OXYCODONE-ACETAMINOPHEN 5-325 MG PO TABS: 2.0000 | ORAL_TABLET | ORAL | Status: DC | PRN

## 2021-06-01 MED ORDER — LEVONORGESTREL 20.1 MCG/DAY IU IUD
1.0000 | INTRAUTERINE_SYSTEM | Freq: Once | INTRAUTERINE | Status: DC
  Filled 2021-06-01: qty 1

## 2021-06-01 MED ORDER — LACTATED RINGERS IV SOLN: 500.0000 mL | Freq: Once | INTRAVENOUS | Status: DC

## 2021-06-01 MED ORDER — OXYTOCIN-SODIUM CHLORIDE 30-0.9 UT/500ML-% IV SOLN
2.5000 [IU]/h | INTRAVENOUS | Status: DC
  Administered 2021-06-02: 2.5 [IU]/h via INTRAVENOUS
  Filled 2021-06-01: qty 500

## 2021-06-01 MED ORDER — LACTATED RINGERS IV SOLN
500.0000 mL | INTRAVENOUS | Status: DC | PRN
  Administered 2021-06-01 (×2): 250 mL via INTRAVENOUS
  Administered 2021-06-02 (×2): 500 mL via INTRAVENOUS

## 2021-06-01 MED ORDER — ACETAMINOPHEN 325 MG PO TABS: 650.0000 mg | ORAL_TABLET | ORAL | Status: DC | PRN

## 2021-06-01 MED ORDER — HYDROXYZINE HCL 50 MG PO TABS
50.0000 mg | ORAL_TABLET | Freq: Four times a day (QID) | ORAL | Status: DC | PRN
Start: 2021-06-01 — End: 2021-06-02

## 2021-06-01 MED ORDER — SODIUM CHLORIDE 0.9 % IV SOLN: 250.0000 mL | INTRAVENOUS | Status: DC | PRN

## 2021-06-01 MED ORDER — FLEET ENEMA 7-19 GM/118ML RE ENEM
1.0000 | ENEMA | RECTAL | Status: DC | PRN
Start: 2021-06-01 — End: 2021-06-02

## 2021-06-01 MED ORDER — SOD CITRATE-CITRIC ACID 500-334 MG/5ML PO SOLN
30.0000 mL | ORAL | Status: DC | PRN
Start: 2021-06-01 — End: 2021-06-02

## 2021-06-01 MED ORDER — EPHEDRINE 5 MG/ML INJ
10.0000 mg | INTRAVENOUS | Status: DC | PRN
Start: 2021-06-01 — End: 2021-06-02

## 2021-06-01 MED ORDER — PHENYLEPHRINE 40 MCG/ML (10ML) SYRINGE FOR IV PUSH (FOR BLOOD PRESSURE SUPPORT)
80.0000 ug | PREFILLED_SYRINGE | INTRAVENOUS | Status: DC | PRN
  Filled 2021-06-01: qty 10

## 2021-06-01 MED ORDER — OXYTOCIN-SODIUM CHLORIDE 30-0.9 UT/500ML-% IV SOLN
1.0000 m[IU]/min | INTRAVENOUS | Status: DC
  Administered 2021-06-01: 2 m[IU]/min via INTRAVENOUS

## 2021-06-01 MED ORDER — OXYCODONE-ACETAMINOPHEN 5-325 MG PO TABS: 1.0000 | ORAL_TABLET | ORAL | Status: DC | PRN

## 2021-06-01 MED ORDER — MISOPROSTOL 50MCG HALF TABLET
50.0000 ug | ORAL_TABLET | ORAL | Status: DC | PRN
  Administered 2021-06-01 (×3): 50 ug via BUCCAL
  Filled 2021-06-01 (×3): qty 1

## 2021-06-01 MED ORDER — SODIUM CHLORIDE 0.9% FLUSH: 3.0000 mL | Freq: Two times a day (BID) | INTRAVENOUS | Status: DC

## 2021-06-01 MED ORDER — LIDOCAINE HCL (PF) 1 % IJ SOLN
INTRAMUSCULAR | Status: DC | PRN
Start: 2021-06-01 — End: 2021-06-02
  Administered 2021-06-01: 2 mL via EPIDURAL
  Administered 2021-06-01: 8 mL via EPIDURAL

## 2021-06-01 MED ORDER — FENTANYL-BUPIVACAINE-NACL 0.5-0.125-0.9 MG/250ML-% EP SOLN
12.0000 mL/h | EPIDURAL | Status: DC | PRN
  Administered 2021-06-01: 12 mL/h via EPIDURAL
  Filled 2021-06-01: qty 250

## 2021-06-01 MED ORDER — DIPHENHYDRAMINE HCL 50 MG/ML IJ SOLN
12.5000 mg | INTRAMUSCULAR | Status: DC | PRN
  Administered 2021-06-01 (×2): 12.5 mg via INTRAVENOUS
  Filled 2021-06-01: qty 1

## 2021-06-01 MED ORDER — LACTATED RINGERS IV SOLN
INTRAVENOUS | Status: DC
Start: 2021-06-01 — End: 2021-06-02

## 2021-06-01 MED ORDER — LIDOCAINE HCL (PF) 1 % IJ SOLN: 30.0000 mL | INTRAMUSCULAR | Status: DC | PRN

## 2021-06-01 NOTE — Progress Notes (Signed)
Labor Progress Note ?Cindy Fletcher is a 26 y.o. G3P1011 at [redacted]w[redacted]d who presented for IOL due to poorly controlled A1GDM.  ? ?S: Doing well. Comfortable with epidural. No concerns at this time.  ? ?O:  ?BP (!) 146/69   Pulse (!) 102   Temp 98 ?F (36.7 ?C) (Oral)   Resp 17   Ht 5' 8.5" (1.74 m)   Wt 131.5 kg   LMP 08/30/2020 (Exact Date)   SpO2 100%   BMI 43.45 kg/m?  ? ?EFM: Baseline 135 bpm, moderate variability, + accels, no decels  ?Toco: Every 2-3 minutes  ? ?CVE: Dilation: 4 ?Effacement (%): 50, 60 ?Cervical Position: Anterior ?Station: -3 ?Presentation: Vertex ?Exam by:: Dr. Gwenlyn Perking ? ? ?A&P: 26 y.o. [id number] [redacted]w[redacted]d  ? ?#Labor: Progressing well. AROM discussed and patient verbally consented. AROM performed with large amount of clear fluid. Mom and baby tolerated this well. Continues to contract every 2-3 minutes. Can consider adding Pitocin as needed. Will reassess in 4 hours, sooner as needed.  ?#Pain: Epidural  ?#FWB: Cat 1  ?#GBS negative ? ?#A1GDM: CBGs at goal. Will continue to monitor q4hrs in latent phase, transition to q2hrs in active phase.  ? ?Genia Del, MD ?7:49 PM ? ?

## 2021-06-01 NOTE — Progress Notes (Signed)
Cindy Fletcher is a 26 y.o. G3P1011 at [redacted]w[redacted]d admitted for induction of labor due to [facility] and uncontrolled GDM. ? ?Subjective: ?Patient reports she feels comfortable. Having some itching as well as some vaginal pressure but no change since previous evaluation ?Objective: ?BP (!) 113/51   Pulse 80   Temp 98.9 ?F (37.2 ?C) (Oral)   Resp 17   Ht 5' 8.5" (1.74 m)   Wt 131.5 kg   LMP 08/30/2020 (Exact Date)   SpO2 100%   BMI 43.45 kg/m?  ?I/O last 3 completed shifts: ?In: 1399.1 [I.V.:1399.1] ?Out: -  ?No intake/output data recorded. ? ?FHT:  FHR: 150 bpm, variability: moderate,  accelerations:  Present,  decelerations:  Absent ?UC:   regular, every 3-5 minutes ?SVE:   Dilation: 5 ?Effacement (%): 80 ?Station: -3 ?Exam by:: Ephriam Jenkins ? ?Labs: ?Lab Results  ?Component Value Date  ? WBC 9.3 06/01/2021  ? HGB 10.6 (L) 06/01/2021  ? HCT 33.4 (L) 06/01/2021  ? MCV 82.7 06/01/2021  ? PLT 229 06/01/2021  ? ? ?Assessment / Plan: ?G3P1011 at [redacted]w[redacted]d admitted for induction of labor due to [facility] and uncontrolled GDM. ? ?Labor:  S/p cytotec x3 and AROM. Cervix now progressed to 5 cm and effaced to 80%. Fetal head station still very high in pelvis. Contracting on own but sometimes spaced out. IUPC placed and will plan to start pitocin. Also discussed positioning to facilitate fetal head descent. Placed in left exaggerated sims with peanut ball at this time. ?Plan to recheck once adequate contractions for 4 hours (or sooner if clinically warranted) ? ?Fetal Wellbeing:  Category I, previously with intermittent periods of late decels that improved with position changes. ?Pain Control:  Epidural ?I/D:   GBS neg ? ? ?#GDM ?BG within goal (in the 80s). Continue q4 hr checks. Will switch to q2hr once more active ? ?#cHTN ?BP mostly normotensive during current shift. Will continue to monitor ? ?Warner Mccreedy ?06/01/2021, 11:50 PM ? ? ?

## 2021-06-01 NOTE — Anesthesia Preprocedure Evaluation (Signed)
Anesthesia Evaluation  ?Patient identified by MRN, date of birth, ID band ?Patient awake ? ? ? ?Reviewed: ?Allergy & Precautions, H&P , NPO status , Patient's Chart, lab work & pertinent test results ? ?History of Anesthesia Complications ?Negative for: history of anesthetic complications ? ?Airway ?Mallampati: II ? ?TM Distance: >3 FB ? ? ? ? Dental ?  ?Pulmonary ?asthma ,  ?  ?Pulmonary exam normal ? ? ? ? ? ? ? Cardiovascular ?hypertension,  ?Rhythm:regular Rate:Normal ? ? ?  ?Neuro/Psych ?negative neurological ROS ? negative psych ROS  ? GI/Hepatic ?negative GI ROS, Neg liver ROS,   ?Endo/Other  ?diabetes, GestationalMorbid obesity ? Renal/GU ?  ? ?  ?Musculoskeletal ? ? Abdominal ?  ?Peds ? Hematology ?negative hematology ROS ?(+)   ?Anesthesia Other Findings ? ? Reproductive/Obstetrics ?(+) Pregnancy ? ?  ? ? ? ? ? ? ? ? ? ? ? ? ? ?  ?  ? ? ? ? ? ? ? ? ?Anesthesia Physical ?Anesthesia Plan ? ?ASA: 3 ? ?Anesthesia Plan: Epidural  ? ?Post-op Pain Management:   ? ?Induction:  ? ?PONV Risk Score and Plan:  ? ?Airway Management Planned:  ? ?Additional Equipment:  ? ?Intra-op Plan:  ? ?Post-operative Plan:  ? ?Informed Consent: I have reviewed the patients History and Physical, chart, labs and discussed the procedure including the risks, benefits and alternatives for the proposed anesthesia with the patient or authorized representative who has indicated his/her understanding and acceptance.  ? ? ? ? ? ?Plan Discussed with:  ? ?Anesthesia Plan Comments:   ? ? ? ? ? ? ?Anesthesia Quick Evaluation ? ?

## 2021-06-01 NOTE — H&P (Addendum)
OBSTETRIC ADMISSION HISTORY AND PHYSICAL ? ?Cindy Fletcher is a 26 y.o. female G3P1011 with IUP at [redacted]w[redacted]d by ultrasound presenting for IOL d/t poorly controlled A1GDM (fastings mostly over 99). She reports +FMs, No LOF, no VB, no blurry vision, headaches or peripheral edema, and RUQ pain.  She plans on breast feeding. She requests post partum liletta IUD for birth control. ?She received her prenatal care at  [facility]   ? ?Dating: By ultrasound --->  Estimated Date of Delivery: 06/20/21 ? ?Sono:   ? ?@[redacted]w[redacted]d, CWD, normal anatomy, cephalic presentation,  4084 g, > 99% EFW ? ? ?Prenatal History/Complications:  ?--A1GDM (fasting glucoses mostly greater than 99 most recently) ?--Hx of A2GDM with last pregnancy (was on metformin) ?--Fetal Macrosomia EFW 99> and 35.4 (pelvis has proven 3209 g) ?--cHTN (no home medications) ?--Short interval pregnancy (last delivery 05/20/20) ? ?Past Medical History: ?Past Medical History:  ?Diagnosis Date  ? Anemia   ? Asthma   ? Gestational diabetes   ? Hypertension   ? Vaginal Pap smear, abnormal   ? ? ?Past Surgical History: ?Past Surgical History:  ?Procedure Laterality Date  ? NO PAST SURGERIES    ? ? ?Obstetrical History: ?OB History   ? ? Gravida  ?3  ? Para  ?1  ? Term  ?1  ? Preterm  ?   ? AB  ?1  ? Living  ?1  ?  ? ? SAB  ?1  ? IAB  ?   ? Ectopic  ?   ? Multiple  ?0  ? Live Births  ?1  ?   ?  ?  ? ? ?Social History ?Social History  ? ?Socioeconomic History  ? Marital status: Significant Other  ?  Spouse name: Ines  ? Number of children: Not on file  ? Years of education: Not on file  ? Highest education level: Not on file  ?Occupational History  ? Not on file  ?Tobacco Use  ? Smoking status: Never  ? Smokeless tobacco: Never  ?Vaping Use  ? Vaping Use: Never used  ?Substance and Sexual Activity  ? Alcohol use: Not Currently  ? Drug use: Not Currently  ?  Types: Marijuana  ?  Comment: last used Dec 2020  ? Sexual activity: Yes  ?  Partners: Male  ?  Birth control/protection: None   ?Other Topics Concern  ? Not on file  ?Social History Narrative  ? Not on file  ? ?Social Determinants of Health  ? ?Financial Resource Strain: Not on file  ?Food Insecurity: Not on file  ?Transportation Needs: Not on file  ?Physical Activity: Not on file  ?Stress: Not on file  ?Social Connections: Not on file  ? ? ?Family History: ?Family History  ?Problem Relation Age of Onset  ? Diabetes Mother   ? ADD / ADHD Father   ? Asthma Father   ? ADD / ADHD Sister   ? Hypertension Paternal Aunt   ? Diabetes Maternal Grandmother   ? Hypertension Maternal Grandmother   ? ADD / ADHD Paternal Grandmother   ? Hypertension Paternal Grandmother   ? ADD / ADHD Paternal Grandfather   ? Asthma Paternal Grandfather   ? Hypertension Paternal Grandfather   ? ? ?Allergies: ?No Known Allergies ? ?Pt denies allergies to latex, iodine, or shellfish. ? ?Medications Prior to Admission  ?Medication Sig Dispense Refill Last Dose  ? Accu-Chek Softclix Lancets lancets 1 each by Other route 4 (four) times daily. 100 each 12 05/31/2021 at   1400  ? aspirin 81 MG chewable tablet Chew by mouth daily.   05/31/2021 at 1400  ? Blood Glucose Monitoring Suppl (ACCU-CHEK GUIDE) w/Device KIT 1 Device by Does not apply route 4 (four) times daily. 1 kit 0 05/31/2021 at 1400  ? glucose blood (ACCU-CHEK GUIDE) test strip Use to check blood sugars four times a day was instructed 50 each 12 05/31/2021 at 1400  ? Iron Polysacch Cmplx-B12-FA 150-0.025-1 MG CAPS Take 1 capsule by mouth every other day. 30 capsule 5 05/31/2021 at 1400  ? Prenatal Vit-Fe Phos-FA-Omega (VITAFOL GUMMIES) 3.33-0.333-34.8 MG CHEW Chew 3 each by mouth daily. 90 tablet 12 05/31/2021 at 1400  ? ? ? ?Review of Systems  ? ?All systems reviewed and negative except as stated in HPI ? ?Blood pressure 134/76, pulse 96, temperature 98.8 ?F (37.1 ?C), resp. rate 17, height 5' 8.5" (1.74 m), weight 131.5 kg, last menstrual period 08/30/2020, currently breastfeeding. ?General appearance: alert,  cooperative, and no distress ?Lungs: no iWOB ?Heart: regular rate and rhythm ?Abdomen: soft, non-tender; bowel sounds normal ?Extremities: Homans sign is negative, no sign of DVT ?Presentation: cephalic ?Fetal monitoringBaseline: 135 bpm, Variability: Good {> 6 bpm), Accelerations: Reactive, and Decelerations: Absent ?Uterine activityFrequency: occasional ?Dilation: 1 ?Effacement (%): 50 ?Station: Ballotable ?Exam by:: Glenice Bow, rnc ? ? ?Prenatal labs: ?ABO, Rh: --/--/A POS (03/18 9458) ?Antibody: PENDING (03/18 5929) ?Rubella: 6.50 (09/23 0849) ?RPR: Non Reactive (01/24 1033)  ?HBsAg: Negative (09/23 0849)  ?HIV: Non Reactive (01/24 1033)  ?GBS: Negative/-- (03/10 1029)  ?2 hr Glucola: 154 elevated ?Genetic screening:  Panorama LR female, negative Horizon ?Anatomy US: normal female ? ?Prenatal Transfer Tool  ?Maternal Diabetes: Yes:  Diabetes Type:  Diet controlled ?Genetic Screening: Normal ?Maternal Ultrasounds/Referrals: Normal ?Fetal Ultrasounds or other Referrals:  Referred to Materal Fetal Medicine  for weekly BPP- last one with EFW of >99% ?Maternal Substance Abuse:  No ?Significant Maternal Medications:  None ?Significant Maternal Lab Results: Group B Strep negative ? ?Results for orders placed or performed during the hospital encounter of 06/01/21 (from the past 24 hour(s))  ?CBC  ? Collection Time: 06/01/21  3:39 AM  ?Result Value Ref Range  ? WBC 9.1 4.0 - 10.5 K/uL  ? RBC 3.96 3.87 - 5.11 MIL/uL  ? Hemoglobin 10.5 (L) 12.0 - 15.0 g/dL  ? HCT 32.6 (L) 36.0 - 46.0 %  ? MCV 82.3 80.0 - 100.0 fL  ? MCH 26.5 26.0 - 34.0 pg  ? MCHC 32.2 30.0 - 36.0 g/dL  ? RDW 15.7 (H) 11.5 - 15.5 %  ? Platelets 247 150 - 400 K/uL  ? nRBC 0.0 0.0 - 0.2 %  ?Type and screen  ? Collection Time: 06/01/21  3:39 AM  ?Result Value Ref Range  ? ABO/RH(D) A POS   ? Antibody Screen PENDING   ? Sample Expiration    ?  06/04/2021,2359 ?Performed at [facility], [place] [address], [place], [place] [address] ?  ? ? ?Patient  Active Problem List  ? Diagnosis Date Noted  ? Indication for care in labor or delivery 06/01/2021  ? History of gestational diabetes 04/03/2021  ? Supervision of high risk pregnancy, antepartum 10/31/2020  ? GBS (group B Streptococcus carrier), +RV culture, currently pregnant 05/11/2020  ? Chronic hypertension complicating or reason for care during pregnancy, third trimester 04/09/2020  ? ASCUS of cervix with negative high risk HPV 11/14/2019  ? Miscarriage 08/01/2019  ? ? ?Assessment/Plan:  ?Tawona Filsinger is a 26 y.o. [phone]  at 43w2dhere for IOL d/t poorly controlled GDM (patient had normal control up until recently (on diet control)  however now having fasting above goal and given fetal macrosomia being induced) ? ?#Labor: IOL starting with cytotec. Will reassess in 4 hours and place balloon if appropriate. ?#Pain: Plan for epidural; open to PRN IV medications/nitrous oxide ?#FWB: Cat I ?#ID:  GBS negative ?#MOF: Breast ?#MOC: ppIUD liletta - consented  ?#Circ:  Yes ? ?#A1GDM ?-CBGs q4h while in ripening phase and then switch to q2hr ? ?#Fetal Macrosomia ?Pelvis has proven to 7 lbs 1 oz with last pregnancy. 4084 g, > 99% EFW at 35.4 wks. Discussed risks and benefits of vaginal delivery with patient including possibility of shoulder dystocia. Also discussed that if patient's cervix not changing and/or fetus not descending appropriately into pelvis during induction process could be atleast partially related to fetal size and at that time depending on the clinical scenario a cesarean delivery could be recommended. Patient expressed understanding. Patient at this time desires to continue with trialing for a vaginal birth.  ? ?#cHTN ?Asymptomatic, no home meds. Will obtain preE labs at admission ?-serial BP ? ? ?MGerrit Heck MTehuacanafor [employer] [place]?06/01/2021, 4:16 AM ? ? ?GME ATTESTATION:  ?I saw and evaluated the patient. I agree with the findings and the plan of  care as documented in the resident?s note and have made all necessary edits. ? ?ARenard Matter MD, MPH ?OB Fellow, Faculty Practice ?CGliddenfor [facility] ?06/01/2021 5:28 AM ? ?  ?

## 2021-06-01 NOTE — Plan of Care (Signed)
  Problem: Education: Goal: Knowledge of Childbirth will improve Outcome: Progressing Goal: Ability to make informed decisions regarding treatment and plan of care will improve Outcome: Progressing Goal: Ability to state and carry out methods to decrease the pain will improve Outcome: Progressing   

## 2021-06-01 NOTE — Anesthesia Procedure Notes (Signed)
Epidural ?Patient location during procedure: OB ?Start time: 06/01/2021 4:44 PM ?End time: 06/01/2021 4:56 PM ? ?Staffing ?Anesthesiologist: Lucretia Kern, MD ?Performed: anesthesiologist  ? ?Preanesthetic Checklist ?Completed: patient identified, IV checked, risks and benefits discussed, monitors and equipment checked, pre-op evaluation and timeout performed ? ?Epidural ?Patient position: sitting ?Prep: DuraPrep ?Patient monitoring: heart rate, continuous pulse ox and blood pressure ?Approach: midline ?Location: L3-L4 ?Injection technique: LOR air ? ?Needle:  ?Needle type: Tuohy  ?Needle gauge: 17 G ?Needle length: 9 cm ?Needle insertion depth: 7 cm ?Catheter type: closed end flexible ?Catheter size: 19 Gauge ?Catheter at skin depth: 13 cm ?Test dose: negative ? ?Assessment ?Events: blood not aspirated, injection not painful, no injection resistance, no paresthesia and negative IV test ? ?Additional Notes ?Reason for block:procedure for pain ? ? ? ?

## 2021-06-01 NOTE — Progress Notes (Signed)
Labor Progress Note [name] is a 26 y.o. G3P1011 at [redacted]w[redacted]d presented for IOL due to poorly controlled A1GDM.  ? ?S: Doing well. Eating lunch. No concerns at this time.  ? ?O:  ?BP (!) 143/82   Pulse 88   Temp 98 ?F (36.7 ?C) (Oral)   Resp 17   Ht 5' 8.5" (1.74 m)   Wt 131.5 kg   LMP 08/30/2020 (Exact Date)   BMI 43.45 kg/m?  ? ?EFM: Baseline 135 bpm, moderate variability, + accels, no decles  ?Toco: 2-4 minutes ? ?CVE: Dilation: 2 ?Effacement (%): 50 ?Cervical Position: Middle ?Station: -3 ?Presentation: Vertex ?Exam by:: J.Cox, RN ? ?A&P: 26 y.o. [id number] [redacted]w[redacted]d  ? ?#Labor: Progressing well s/p Cytotec x3. Will continue for now every 4 hours. Plan to reassess at 1630. Plan for AROM and Pitocin next check as able.  ?#Pain: PRN ?#FWB: Cat 1  ?#GBS negative ? ?#A1GDM: CBGs stable and at goal. Will continue to monitor every 4 hours in latent phase.  ? ?Worthy Rancher, MD ?1:12 PM ? ?

## 2021-06-02 ENCOUNTER — Encounter (HOSPITAL_COMMUNITY): Payer: Self-pay | Admitting: Family Medicine

## 2021-06-02 DIAGNOSIS — O3663X1 Maternal care for excessive fetal growth, third trimester, fetus 1: Secondary | ICD-10-CM

## 2021-06-02 DIAGNOSIS — O24420 Gestational diabetes mellitus in childbirth, diet controlled: Secondary | ICD-10-CM

## 2021-06-02 DIAGNOSIS — O1092 Unspecified pre-existing hypertension complicating childbirth: Secondary | ICD-10-CM

## 2021-06-02 DIAGNOSIS — Z3A37 37 weeks gestation of pregnancy: Secondary | ICD-10-CM

## 2021-06-02 LAB — GLUCOSE, CAPILLARY
Glucose-Capillary: 108 mg/dL — ABNORMAL HIGH (ref 70–99)
Glucose-Capillary: 77 mg/dL (ref 70–99)

## 2021-06-02 MED ORDER — BENZOCAINE-MENTHOL 20-0.5 % EX AERO: 1.0000 "application " | INHALATION_SPRAY | CUTANEOUS | Status: DC | PRN

## 2021-06-02 MED ORDER — COCONUT OIL OIL
1.0000 "application " | TOPICAL_OIL | Status: DC | PRN
  Administered 2021-06-03: 1 via TOPICAL

## 2021-06-02 MED ORDER — SENNOSIDES-DOCUSATE SODIUM 8.6-50 MG PO TABS
2.0000 | ORAL_TABLET | Freq: Every day | ORAL | Status: DC
  Filled 2021-06-02: qty 2

## 2021-06-02 MED ORDER — DIPHENHYDRAMINE HCL 25 MG PO CAPS
25.0000 mg | ORAL_CAPSULE | Freq: Four times a day (QID) | ORAL | Status: DC | PRN
Start: 2021-06-02 — End: 2021-06-03

## 2021-06-02 MED ORDER — ONDANSETRON HCL 4 MG/2ML IJ SOLN: 4.0000 mg | INTRAMUSCULAR | Status: DC | PRN

## 2021-06-02 MED ORDER — ONDANSETRON HCL 4 MG PO TABS: 4.0000 mg | ORAL_TABLET | ORAL | Status: DC | PRN

## 2021-06-02 MED ORDER — WITCH HAZEL-GLYCERIN EX PADS: 1.0000 "application " | MEDICATED_PAD | CUTANEOUS | Status: DC | PRN

## 2021-06-02 MED ORDER — ACETAMINOPHEN 325 MG PO TABS: 650.0000 mg | ORAL_TABLET | ORAL | Status: DC | PRN

## 2021-06-02 MED ORDER — TETANUS-DIPHTH-ACELL PERTUSSIS 5-2.5-18.5 LF-MCG/0.5 IM SUSY: 0.5000 mL | PREFILLED_SYRINGE | Freq: Once | INTRAMUSCULAR | Status: DC

## 2021-06-02 MED ORDER — IBUPROFEN 600 MG PO TABS
600.0000 mg | ORAL_TABLET | Freq: Four times a day (QID) | ORAL | Status: DC
  Administered 2021-06-02 – 2021-06-03 (×6): 600 mg via ORAL
  Filled 2021-06-02 (×5): qty 1

## 2021-06-02 MED ORDER — MEASLES, MUMPS & RUBELLA VAC IJ SOLR: 0.5000 mL | Freq: Once | INTRAMUSCULAR | Status: DC

## 2021-06-02 MED ORDER — MEDROXYPROGESTERONE ACETATE 150 MG/ML IM SUSP: 150.0000 mg | INTRAMUSCULAR | Status: DC | PRN

## 2021-06-02 MED ORDER — PRENATAL MULTIVITAMIN CH
1.0000 | ORAL_TABLET | Freq: Every day | ORAL | Status: DC
  Administered 2021-06-02 – 2021-06-03 (×2): 1 via ORAL
  Filled 2021-06-02 (×2): qty 1

## 2021-06-02 MED ORDER — DIBUCAINE (PERIANAL) 1 % EX OINT: 1.0000 "application " | TOPICAL_OINTMENT | CUTANEOUS | Status: DC | PRN

## 2021-06-02 MED ORDER — SIMETHICONE 80 MG PO CHEW: 80.0000 mg | CHEWABLE_TABLET | ORAL | Status: DC | PRN

## 2021-06-02 NOTE — Lactation Note (Signed)
This note was copied from a baby's chart. ?Lactation Consultation Note ? ?Patient Name: Cindy Fletcher ?Today's Date: 06/02/2021 ?Reason for consult: Initial assessment;Early term 37-38.6wks;Difficult latch;Maternal endocrine disorder ?Age:26 hours ? ?LC in to room for initial visit. Mother states baby breastfed for ~5 minutes. LC observed active baby displaying hunger cues. [facility] student assisted with latch, several attempts unsuccessful. Reviewed hand expression, collected ~2 mL and spoonfed. Mother explains she has stored breast milk at home. LC provided hand pump per mother's request.  ? ?Reviewed newborn behavior, feeding patterns and expectations with mother.  ? ?Plan:   ?1-Breastfeeding on demand, ensuring a deep, comfortable latch.  ?2-Use hand pump as needed for stimulation and supplementation ?3-Encouraged maternal hydration, nutrition and rest. ? ?Contact LC as needed for feeds/support/concerns/questions. All questions answered at this time. [facility] brochure and In[employer] booklet reviewed.  ? ?Maternal Data ?Has patient been taught Hand Expression?: Yes ?Does the patient have breastfeeding experience prior to this delivery?: Yes ?How long did the patient breastfeed?: 4 months, stop due to current pregnancy ? ?Feeding ?Mother's Current Feeding Choice: Breast Milk ? ?LATCH Score ?Latch: Repeated attempts needed to sustain latch, nipple held in mouth throughout feeding, stimulation needed to elicit sucking reflex. ? ?Audible Swallowing: None ? ?Type of Nipple: Everted at rest and after stimulation ? ?Comfort (Breast/Nipple): Soft / non-tender ? ?Hold (Positioning): Assistance needed to correctly position infant at breast and maintain latch. ? ?LATCH Score: 6 ? ? ?Lactation Tools Discussed/Used ?Tools: Pump;Flanges ?Flange Size: 24 ?Breast pump type: Manual ?Pump Education: Milk Storage;Setup, frequency, and cleaning ?Reason for Pumping: mother's request ?Pumping frequency: as needed ? ?Interventions ?Interventions:  Breast feeding basics reviewed;Assisted with latch;Skin to skin;Breast massage;Hand express;Breast compression;Adjust position;Support pillows;Expressed milk;Hand pump;Education;LC Services brochure ? ?Discharge ?Pump: Manual;Personal ? ?Consult Status ?Consult Status: Follow-up ?Date: 06/03/21 ?Follow-up type: In-patient ? ? ? ?Janny Crute A Higuera Ancidey ?06/02/2021, 7:15 PM ? ? ? ?

## 2021-06-02 NOTE — Discharge Summary (Signed)
? ?  Postpartum Discharge Summary ? ?Patient Name: Cindy Fletcher ?DOB: Jul 21, 1995 ?MRN: [id number] ? ?Date of admission: 06/01/2021 ?Delivery date:06/02/2021  ?Delivering provider: Renard Matter  ?Date of discharge: 06/03/2021 ? ?Admitting diagnosis: Indication for care in labor or delivery [O75.9] ?Intrauterine pregnancy: [redacted]w[redacted]d    ?Secondary diagnosis:  Principal Problem: ?  Indication for care in labor or delivery ?Active Problems: ?  ASCUS of cervix with negative high risk HPV ?  Chronic hypertension complicating or reason for care during pregnancy, third trimester ?  GBS (group B Streptococcus carrier), +RV culture, currently pregnant ?  Supervision of high risk pregnancy, antepartum ?  History of gestational diabetes ?  Vaginal delivery ?  Shoulder dystocia, delivered ? ?Additional problems: None    ?Discharge diagnosis: Term Pregnancy Delivered, CHTN, and GDM A2                                              ?Post partum procedures: None ?Augmentation: AROM, Pitocin, and Cytotec ?Complications: None ? ?Hospital course: Induction of Labor With Vaginal Delivery   ?26y.o. yo G3P1011 at 367w3das admitted to the hospital 06/01/2021 for induction of labor.  Indication for induction: A2 DM.  Patient had an uncomplicated labor course as follows: ?Membrane Rupture Time/Date: 7:38 PM ,06/01/2021   ?Delivery Method:Vaginal, Spontaneous  ?Episiotomy: None  ?Lacerations:  None  ?Details of delivery can be found in separate delivery note.  Patient had a routine postpartum course. Due to her hx of cHTN she was started on a 5 day course of PO lasix. She also had more than three blood pressures greater than 130 or greater than 80 so she was started on Procardia 3092maily. Patient is discharged home 06/03/21. ? ?Newborn Data: ?Birth date:06/02/2021  ?Birth time:3:05 AM  ?Gender:Female  ?Living status:Living  ?Apgars:8 ,9  ?Weight:4030 g  ? ?Magnesium Sulfate received: No ?BMZ received: No ?Rhophylac:N/A ?MMR:N/A ?T-DaP:Given  prenatally ?Flu: declined ?Transfusion:No ? ?Physical exam  ?Vitals:  ? 06/02/21 1445 06/02/21 1755 06/02/21 2027 06/03/21 0543  ?BP: (!) 147/66 (!) 126/58 124/72 123/87  ?Pulse: 67 (!) 55 62 67  ?Resp: _0 ?Temp: 98.2 ?F (36.8 ?C) 98.4 ?F (36.9 ?C) 98.4 ?F (36.9 ?C) 98.6 ?F (37 ?C)  ?TempSrc: Axillary Oral Oral Oral  ?SpO2:   99% 99%  ?Weight:      ?Height:      ? ?General: alert ?Lochia: appropriate ?Uterine Fundus: firm ?Incision: N/A ?DVT Evaluation: No evidence of DVT seen on physical exam. ?Labs: ?Lab Results  ?Component Value Date  ? WBC 9.3 06/01/2021  ? HGB 10.6 (L) 06/01/2021  ? HCT 33.4 (L) 06/01/2021  ? MCV 82.7 06/01/2021  ? PLT 229 06/01/2021  ? ?CMP Latest Ref Rng & Units 06/01/2021  ?Glucose 70 - 99 mg/dL 125(H)  ?BUN 6 - 20 mg/dL 7  ?Creatinine 0.44 - 1.00 mg/dL 0.53  ?Sodium 135 - 145 mmol/L 136  ?Potassium 3.5 - 5.1 mmol/L 3.6  ?Chloride 98 - 111 mmol/L 103  ?CO2 22 - 32 mmol/L 22  ?Calcium 8.9 - 10.3 mg/dL 8.9  ?Total Protein 6.5 - 8.1 g/dL 6.0(L)  ?Total Bilirubin 0.3 - 1.2 mg/dL 0.8  ?Alkaline Phos 38 - 126 U/L 152(H)  ?AST 15 - 41 U/L 26  ?ALT 0 - 44 U/L 12  ? ?Edinburgh Score: ?Edinburgh Postnatal Depression Scale  Screening Tool 06/02/2021  ?I have been able to laugh and see the funny side of things. 0  ?I have looked forward with enjoyment to things. 0  ?I have blamed myself unnecessarily when things went wrong. 0  ?I have been anxious or worried for no good reason. 1  ?I have felt scared or panicky for no good reason. 0  ?Things have been getting on top of me. 1  ?I have been so unhappy that I have had difficulty sleeping. 0  ?I have felt sad or miserable. 0  ?I have been so unhappy that I have been crying. 0  ?The thought of harming myself has occurred to me. 0  ?Edinburgh Postnatal Depression Scale Total 2  ? ? ? ?After visit meds:  ?Allergies as of 06/03/2021   ?No Known Allergies ?  ? ?  ?Medication List  ?  ? ?STOP taking these medications   ? ?Accu-Chek Guide test strip ?Generic  drug: glucose blood ?  ?Accu-Chek Guide w/Device Kit ?  ?Accu-Chek Softclix Lancets lancets ?  ?aspirin 81 MG chewable tablet ?  ?Iron Polysacch Cmplx-B12-FA 150-0.025-1 MG Caps ?  ? ?  ? ?TAKE these medications   ? ?acetaminophen 325 MG tablet ?Commonly known as: Tylenol ?Take 2 tablets (650 mg total) by mouth every 4 (four) hours as needed (for pain scale < 4). ?  ?furosemide 20 MG tablet ?Commonly known as: LASIX ?Take 1 tablet (20 mg total) by mouth daily for 4 days. ?  ?ibuprofen 600 MG tablet ?Commonly known as: ADVIL ?Take 1 tablet (600 mg total) by mouth every 6 (six) hours. ?  ?NIFEdipine 30 MG 24 hr tablet ?Commonly known as: ADALAT CC ?Take 1 tablet (30 mg total) by mouth daily. ?  ?Vitafol Gummies 3.33-0.333-34.8 MG Chew ?Chew 3 each by mouth daily. ?  ? ?  ? ? ? ?Discharge home in stable condition ?Infant Feeding: Breast ?Infant Disposition:home with mother ?Discharge instruction: per After Visit Summary and Postpartum booklet. ?Activity: Advance as tolerated. Pelvic rest for 6 weeks.  ?Diet: routine diet ?Future Appointments:No future appointments. ?Follow up Visit: ?Message sent to [facility] by Dr. Cy Blamer on 3/19 ? ?Please schedule this patient for a In person postpartum visit in 6 weeks with the following provider: MD. ?Additional Postpartum F/U:2 hour GTT and BP check 1 week  ?High risk pregnancy complicated by: GDM and HTN ?Delivery mode:  Vaginal, Spontaneous  ?Anticipated Birth Control:   outpt IUD (couldn't get PP due to private insurance) ? ?Renard Matter, MD, MPH ?OB Fellow, Faculty Practice ? ? ? ? ?

## 2021-06-02 NOTE — Lactation Note (Signed)
This note was copied from a baby's chart. ?Lactation Consultation Note ? ?Patient Name: Cindy Fletcher ?Today's Date: 06/02/2021 ?  ?Age:26 hours ? ? ?LC Note: ? ?Per RN, mother declined lactation services. ? ? ?Maternal Data ?  ? ?Feeding ?  ? ?LATCH Score ?  ? ?  ? ?  ? ?  ? ?  ? ?  ? ? ?Lactation Tools Discussed/Used ?  ? ?Interventions ?  ? ?Discharge ?  ? ?Consult Status ?  ? ? ? ?[name] ?06/02/2021, 6:10 AM ? ? ? ?

## 2021-06-02 NOTE — Anesthesia Postprocedure Evaluation (Signed)
Anesthesia Post Note ? ?Patient: Cindy Fletcher ? ?Procedure(s) Performed: AN AD HOC LABOR EPIDURAL ? ?  ? ?Patient location during evaluation: Mother Baby ?Anesthesia Type: Epidural ?Level of consciousness: awake and alert ?Pain management: pain level controlled ?Vital Signs Assessment: post-procedure vital signs reviewed and stable ?Respiratory status: spontaneous breathing, nonlabored ventilation and respiratory function stable ?Cardiovascular status: stable ?Postop Assessment: no headache, no backache, epidural receding, no apparent nausea or vomiting, patient able to bend at knees, adequate PO intake and able to ambulate ?Anesthetic complications: no ? ? ?No notable events documented. ? ?Last Vitals:  ?Vitals:  ? 06/02/21 1100 06/02/21 1445  ?BP: 133/69 (!) 147/66  ?Pulse: 68 67  ?Resp: 18 18  ?Temp: 36.4 ?C 36.8 ?C  ?SpO2:    ?  ?Last Pain:  ?Vitals:  ? 06/02/21 1445  ?TempSrc: Axillary  ?PainSc: 0-No pain  ? ?Pain Goal:   ? ?  ?  ?  ?  ?  ?  ?  ? ?Cindy Fletcher ? ? ? ? ?

## 2021-06-02 NOTE — Lactation Note (Signed)
This note was copied from a baby's chart. ?Lactation Consultation Note ?RN felt like LC needed to see mom. ?Mom had just finished pumping and was fixing to latch baby. Assisted w/positioning pillows for support. ?Mom has great everted nipples and baby latches well. Just not very hungry at the moment. Parents state baby has spit up several times. ?Newborn feeding habits reviewed. ?Baby suckled well a few times then stops and starts back.  Mom denies painful latch. ?Encouraged to call for assistance as needed. ? ?Patient Name: Cindy Fletcher ?Today's Date: 06/02/2021 ?Reason for consult: Follow-up assessment ?Age:26 hours ? ?Maternal Data ?Has patient been taught Hand Expression?: Yes ?Does the patient have breastfeeding experience prior to this delivery?: Yes ?How long did the patient breastfeed?: 4 months, stop due to current pregnancy ? ?Feeding ?Mother's Current Feeding Choice: Breast Milk ? ?LATCH Score ?Latch: Grasps breast easily, tongue down, lips flanged, rhythmical sucking. ? ?Audible Swallowing: None ? ?Type of Nipple: Everted at rest and after stimulation ? ?Comfort (Breast/Nipple): Soft / non-tender ? ?Hold (Positioning): Assistance needed to correctly position infant at breast and maintain latch. ? ?LATCH Score: 7 ? ? ?Lactation Tools Discussed/Used ?Tools: Pump;Flanges ?Flange Size: 24 ?Breast pump type: Manual ?Pump Education: Milk Storage;Setup, frequency, and cleaning ?Reason for Pumping: mother's request ?Pumping frequency: as needed ? ?Interventions ?Interventions: Breast feeding basics reviewed;Assisted with latch;Skin to skin;Breast massage;Hand express;Breast compression;Adjust position;Support pillows;Expressed milk;Hand pump;Education;LC Services brochure ? ?Discharge ?Pump: Manual;Personal ? ?Consult Status ?Consult Status: Follow-up ?Date: 06/03/21 ?Follow-up type: In-patient ? ? ? ?Charyl Dancer ?06/02/2021, 10:25 PM ? ? ? ?

## 2021-06-02 NOTE — Plan of Care (Signed)
?  Problem: Education: ?Goal: Knowledge of General Education information will improve ?Description: Including pain rating scale, medication(s)/side effects and non-pharmacologic comfort measures ?Outcome: Progressing ?  ?Problem: Health Behavior/Discharge Planning: ?Goal: Ability to manage health-related needs will improve ?Outcome: Progressing ?  ?Problem: Clinical Measurements: ?Goal: Ability to maintain clinical measurements within normal limits will improve ?Outcome: Progressing ?Goal: Will remain free from infection ?Outcome: Progressing ?Goal: Diagnostic test results will improve ?Outcome: Progressing ?Goal: Respiratory complications will improve ?Outcome: Progressing ?Goal: Cardiovascular complication will be avoided ?Outcome: Progressing ?  ?Problem: Activity: ?Goal: Risk for activity intolerance will decrease ?Outcome: Progressing ?  ?Problem: Nutrition: ?Goal: Adequate nutrition will be maintained ?Outcome: Progressing ?  ?Problem: Coping: ?Goal: Level of anxiety will decrease ?Outcome: Progressing ?  ?Problem: Elimination: ?Goal: Will not experience complications related to bowel motility ?Outcome: Progressing ?Goal: Will not experience complications related to urinary retention ?Outcome: Progressing ?  ?Problem: Pain Managment: ?Goal: General experience of comfort will improve ?Outcome: Progressing ?  ?Problem: Safety: ?Goal: Ability to remain free from injury will improve ?Outcome: Progressing ?  ?Problem: Skin Integrity: ?Goal: Risk for impaired skin integrity will decrease ?Outcome: Progressing ?  ?Problem: Education: ?Goal: Knowledge of condition will improve ?Outcome: Progressing ?Goal: Individualized Educational Video(s) ?Outcome: Progressing ?Goal: Individualized Newborn Educational Video(s) ?Outcome: Progressing ?  ?Problem: Activity: ?Goal: Will verbalize the importance of balancing activity with adequate rest periods ?Outcome: Progressing ?Goal: Ability to tolerate increased activity will  improve ?Outcome: Progressing ?  ?Problem: Coping: ?Goal: Ability to identify and utilize available resources and services will improve ?Outcome: Progressing ?  ?Problem: Life Cycle: ?Goal: Chance of risk for complications during the postpartum period will decrease ?Outcome: Progressing ?  ?Problem: Role Relationship: ?Goal: Ability to demonstrate positive interaction with newborn will improve ?Outcome: Progressing ?  ?Problem: Skin Integrity: ?Goal: Demonstration of wound healing without infection will improve ?Outcome: Progressing ?  ? ?Pt admitted to [facility] from [facility]. Bleeding and VS WDL. Uterus firm, midline, U/E. Ibuprofen administered for pain. Continue to monitor and support as needed. ?

## 2021-06-03 ENCOUNTER — Other Ambulatory Visit (HOSPITAL_COMMUNITY): Payer: Self-pay

## 2021-06-03 LAB — GLUCOSE, CAPILLARY: Glucose-Capillary: 114 mg/dL — ABNORMAL HIGH (ref 70–99)

## 2021-06-03 MED ORDER — NIFEDIPINE ER 30 MG PO TB24
30.0000 mg | ORAL_TABLET | Freq: Every day | ORAL | 1 refills | Status: AC
  Filled 2021-06-03: qty 30, 30d supply, fill #0

## 2021-06-03 MED ORDER — FUROSEMIDE 20 MG PO TABS
20.0000 mg | ORAL_TABLET | Freq: Every day | ORAL | 0 refills | Status: AC
  Filled 2021-06-03: qty 4, 4d supply, fill #0

## 2021-06-03 MED ORDER — FUROSEMIDE 20 MG PO TABS
20.0000 mg | ORAL_TABLET | Freq: Every day | ORAL | Status: DC
  Administered 2021-06-03: 20 mg via ORAL
  Filled 2021-06-03: qty 1

## 2021-06-03 MED ORDER — NIFEDIPINE ER OSMOTIC RELEASE 30 MG PO TB24
30.0000 mg | ORAL_TABLET | Freq: Every day | ORAL | Status: DC
  Administered 2021-06-03: 30 mg via ORAL
  Filled 2021-06-03: qty 1

## 2021-06-03 MED ORDER — IBUPROFEN 600 MG PO TABS
600.0000 mg | ORAL_TABLET | Freq: Four times a day (QID) | ORAL | 0 refills | Status: AC
  Filled 2021-06-03: qty 30, 8d supply, fill #0

## 2021-06-03 MED ORDER — ACETAMINOPHEN 325 MG PO TABS
650.0000 mg | ORAL_TABLET | ORAL | 0 refills | Status: AC | PRN
  Filled 2021-06-03: qty 60, 5d supply, fill #0

## 2021-06-04 ENCOUNTER — Other Ambulatory Visit: Payer: Self-pay

## 2021-06-04 ENCOUNTER — Encounter (HOSPITAL_COMMUNITY): Payer: Self-pay | Admitting: Obstetrics and Gynecology

## 2021-06-04 ENCOUNTER — Inpatient Hospital Stay (HOSPITAL_COMMUNITY)
Admission: AD | Admit: 2021-06-04 | Discharge: 2021-06-04 | Disposition: A | Discharge status: Home / Self Care | Payer: BC Managed Care – PPO | Attending: Obstetrics and Gynecology | Admitting: Obstetrics and Gynecology

## 2021-06-04 DIAGNOSIS — O99325 Drug use complicating the puerperium: Secondary | ICD-10-CM | POA: Insufficient documentation

## 2021-06-04 DIAGNOSIS — G4440 Drug-induced headache, not elsewhere classified, not intractable: Secondary | ICD-10-CM | POA: Diagnosis not present

## 2021-06-04 DIAGNOSIS — R519 Headache, unspecified: Secondary | ICD-10-CM | POA: Insufficient documentation

## 2021-06-04 DIAGNOSIS — O909 Complication of the puerperium, unspecified: Secondary | ICD-10-CM | POA: Diagnosis not present

## 2021-06-04 LAB — CBC WITH DIFFERENTIAL/PLATELET
Abs Immature Granulocytes: 0.05 10*3/uL (ref 0.00–0.07)
Basophils Absolute: 0 10*3/uL (ref 0.0–0.1)
Basophils Relative: 0 %
Eosinophils Absolute: 0.2 10*3/uL (ref 0.0–0.5)
Eosinophils Relative: 2 %
HCT: 32.6 % — ABNORMAL LOW (ref 36.0–46.0)
Hemoglobin: 10.3 g/dL — ABNORMAL LOW (ref 12.0–15.0)
Immature Granulocytes: 1 %
Lymphocytes Relative: 24 %
Lymphs Abs: 2.2 10*3/uL (ref 0.7–4.0)
MCH: 26.4 pg (ref 26.0–34.0)
MCHC: 31.6 g/dL (ref 30.0–36.0)
MCV: 83.6 fL (ref 80.0–100.0)
Monocytes Absolute: 0.8 10*3/uL (ref 0.1–1.0)
Monocytes Relative: 8 %
Neutro Abs: 6.1 10*3/uL (ref 1.7–7.7)
Neutrophils Relative %: 65 %
Platelets: 262 10*3/uL (ref 150–400)
RBC: 3.9 MIL/uL (ref 3.87–5.11)
RDW: 16.2 % — ABNORMAL HIGH (ref 11.5–15.5)
WBC: 9.3 10*3/uL (ref 4.0–10.5)
nRBC: 0 % (ref 0.0–0.2)

## 2021-06-04 LAB — COMPREHENSIVE METABOLIC PANEL
ALT: 14 U/L (ref 0–44)
AST: 23 U/L (ref 15–41)
Albumin: 2.5 g/dL — ABNORMAL LOW (ref 3.5–5.0)
Alkaline Phosphatase: 113 U/L (ref 38–126)
Anion gap: 8 (ref 5–15)
BUN: 5 mg/dL — ABNORMAL LOW (ref 6–20)
CO2: 28 mmol/L (ref 22–32)
Calcium: 9.2 mg/dL (ref 8.9–10.3)
Chloride: 104 mmol/L (ref 98–111)
Creatinine, Ser: 0.63 mg/dL (ref 0.44–1.00)
GFR, Estimated: >60 mL/min (ref >60)
Glucose, Bld: 96 mg/dL (ref 70–99)
Potassium: 3.6 mmol/L (ref 3.5–5.1)
Sodium: 140 mmol/L (ref 135–145)
Total Bilirubin: 0.5 mg/dL (ref 0.3–1.2)
Total Protein: 6 g/dL — ABNORMAL LOW (ref 6.5–8.1)

## 2021-06-04 MED ORDER — ACETAMINOPHEN-CAFFEINE 500-65 MG PO TABS
2.0000 | ORAL_TABLET | Freq: Once | ORAL | Status: AC
  Administered 2021-06-04: 2 via ORAL
  Filled 2021-06-04: qty 2

## 2021-06-04 MED ORDER — IBUPROFEN 800 MG PO TABS
800.0000 mg | ORAL_TABLET | Freq: Once | ORAL | Status: AC
  Administered 2021-06-04: 800 mg via ORAL
  Filled 2021-06-04: qty 1

## 2021-06-04 NOTE — Progress Notes (Signed)
Thressa Sheller CNM in to discuss test results and d/c results with pt. WRitten and verbal d/c instructions given and understanding voiced. ?

## 2021-06-04 NOTE — MAU Provider Note (Signed)
?History  ?  ? ?CSN: [id number] ? ?Arrival date and time: 06/04/21 2006 ? ? Event Date/Time  ? First Provider Initiated Contact with Patient 06/04/21 2044   ?  ? ?Chief Complaint  ?Patient presents with  ? Headache  ? Dizziness  ? ?Cindy Fletcher is a 26 y.o. [id number] who 2 days PP s/p NSVD on 06/02/2021. She was DC home 06/03/2021 on procardia and lasix for [facility]. She states that she took her meds as prescribed today, but that she has now developed a headache. She rates her headache 4/10. She has not taken anything for it.  ? ?Headache  ?This is a new problem. The current episode started today. The problem occurs constantly. The problem has been unchanged. The pain is located in the Frontal region. The pain does not radiate. The quality of the pain is described as aching. The pain is at a severity of 4/10. Nothing aggravates the symptoms. She has tried nothing for the symptoms.  ? ?OB History   ? ? Gravida  ?3  ? Para  ?2  ? Term  ?2  ? Preterm  ?   ? AB  ?1  ? Living  ?2  ?  ? ? SAB  ?1  ? IAB  ?   ? Ectopic  ?   ? Multiple  ?0  ? Live Births  ?2  ?   ?  ?  ? ? ?Past Medical History:  ?Diagnosis Date  ? Anemia   ? Asthma   ? last used inhaler 8y ago  ? Gestational diabetes   ? Hypertension   ? Vaginal Pap smear, abnormal   ? ? ?Past Surgical History:  ?Procedure Laterality Date  ? NO PAST SURGERIES    ? ? ?Family History  ?Problem Relation Age of Onset  ? Diabetes Mother   ? ADD / ADHD Father   ? Asthma Father   ? ADD / ADHD Sister   ? Hypertension Paternal Aunt   ? Diabetes Maternal Grandmother   ? Hypertension Maternal Grandmother   ? ADD / ADHD Paternal Grandmother   ? Hypertension Paternal Grandmother   ? ADD / ADHD Paternal Grandfather   ? Asthma Paternal Grandfather   ? Hypertension Paternal Grandfather   ? ? ?Social History  ? ?Tobacco Use  ? Smoking status: Never  ? Smokeless tobacco: Never  ?Vaping Use  ? Vaping Use: Never used  ?Substance Use Topics  ? Alcohol use: Not Currently  ? Drug use: Not  Currently  ?  Types: Marijuana  ?  Comment: last used Dec 2020  ? ? ?Allergies: No Known Allergies ? ?Medications Prior to Admission  ?Medication Sig Dispense Refill Last Dose  ? furosemide (LASIX) 20 MG tablet Take 1 tablet (20 mg total) by mouth daily for 4 days. 4 tablet 0 06/04/2021  ? ibuprofen (ADVIL) 600 MG tablet Take 1 tablet (600 mg total) by mouth every 6 (six) hours. 30 tablet 0 06/04/2021 at 1100  ? NIFEdipine (ADALAT CC) 30 MG 24 hr tablet Take 1 tablet (30 mg total) by mouth daily. 30 tablet 1 06/04/2021  ? Prenatal Vit-Fe Phos-FA-Omega (VITAFOL GUMMIES) 3.33-0.333-34.8 MG CHEW Chew 3 each by mouth daily. 90 tablet 12 06/03/2021  ? acetaminophen (TYLENOL) 325 MG tablet Take 2 tablets (650 mg total) by mouth every 4 (four) hours as needed (for pain scale < 4). 60 tablet 0   ? ? ?Review of Systems  ?All other systems reviewed and are negative. ?Physical  Exam  ? ?Blood pressure (!) 135/55, pulse 90, height 5' 8.5" (1.74 m), weight 124.7 kg, SpO2 99 %, currently breastfeeding. ? ?Physical Exam ?Constitutional:   ?   Appearance: She is well-developed.  ?HENT:  ?   Head: Normocephalic.  ?Eyes:  ?   Pupils: Pupils are equal, round, and reactive to light.  ?Cardiovascular:  ?   Rate and Rhythm: Normal rate and regular rhythm.  ?   Heart sounds: Normal heart sounds.  ?Pulmonary:  ?   Effort: Pulmonary effort is normal. No respiratory distress.  ?   Breath sounds: Normal breath sounds.  ?Abdominal:  ?   Palpations: Abdomen is soft.  ?   Tenderness: There is no abdominal tenderness.  ?Genitourinary: ?   Vagina: No bleeding. Vaginal discharge: mucusy. ?   Comments: External: no lesion ?Vagina: small amount of white discharge ?  ? ? ?Musculoskeletal:     ?   General: Normal range of motion.  ?   Cervical back: Normal range of motion and neck supple.  ?Skin: ?   General: Skin is warm and dry.  ?Neurological:  ?   Mental Status: She is alert and oriented to person, place, and time.  ?Psychiatric:     ?   Mood and  Affect: Mood normal.     ?   Behavior: Behavior normal.  ? ?Patient Vitals for the past 24 hrs: ? BP Pulse SpO2 Height Weight  ?06/04/21 2316 (!) 135/55 90 -- -- --  ?06/04/21 2231 (!) 130/92 (!) 104 -- -- --  ?06/04/21 2207 128/64 89 -- -- --  ?06/04/21 2116 (!) 142/61 85 -- -- --  ?06/04/21 2108 (!) 147/64 89 -- -- --  ?06/04/21 2046 138/63 88 -- -- --  ?06/04/21 2036 (!) 147/75 98 -- -- --  ?06/04/21 2020 (!) 142/80 -- -- -- --  ?06/04/21 2019 -- 96 99 % 5' 8.5" (1.74 m) 124.7 kg  ? ? ?Results for orders placed or performed during the hospital encounter of 06/04/21 (from the past 24 hour(s))  ?CBC with Differential/Platelet     Status: Abnormal  ? Collection Time: 06/04/21  8:54 PM  ?Result Value Ref Range  ? WBC 9.3 4.0 - 10.5 K/uL  ? RBC 3.90 3.87 - 5.11 MIL/uL  ? Hemoglobin 10.3 (L) 12.0 - 15.0 g/dL  ? HCT 32.6 (L) 36.0 - 46.0 %  ? MCV 83.6 80.0 - 100.0 fL  ? MCH 26.4 26.0 - 34.0 pg  ? MCHC 31.6 30.0 - 36.0 g/dL  ? RDW 16.2 (H) 11.5 - 15.5 %  ? Platelets 262 150 - 400 K/uL  ? nRBC 0.0 0.0 - 0.2 %  ? Neutrophils Relative % 65 %  ? Neutro Abs 6.1 1.7 - 7.7 K/uL  ? Lymphocytes Relative 24 %  ? Lymphs Abs 2.2 0.7 - 4.0 K/uL  ? Monocytes Relative 8 %  ? Monocytes Absolute 0.8 0.1 - 1.0 K/uL  ? Eosinophils Relative 2 %  ? Eosinophils Absolute 0.2 0.0 - 0.5 K/uL  ? Basophils Relative 0 %  ? Basophils Absolute 0.0 0.0 - 0.1 K/uL  ? Immature Granulocytes 1 %  ? Abs Immature Granulocytes 0.05 0.00 - 0.07 K/uL  ?Comprehensive metabolic panel     Status: Abnormal  ? Collection Time: 06/04/21  8:54 PM  ?Result Value Ref Range  ? Sodium 140 135 - 145 mmol/L  ? Potassium 3.6 3.5 - 5.1 mmol/L  ? Chloride 104 98 - 111 mmol/L  ? CO2 28  22 - 32 mmol/L  ? Glucose, Bld 96 70 - 99 mg/dL  ? BUN 5 (L) 6 - 20 mg/dL  ? Creatinine, Ser 0.63 0.44 - 1.00 mg/dL  ? Calcium 9.2 8.9 - 10.3 mg/dL  ? Total Protein 6.0 (L) 6.5 - 8.1 g/dL  ? Albumin 2.5 (L) 3.5 - 5.0 g/dL  ? AST 23 15 - 41 U/L  ? ALT 14 0 - 44 U/L  ? Alkaline Phosphatase 113 38 -  126 U/L  ? Total Bilirubin 0.5 0.3 - 1.2 mg/dL  ? GFR, Estimated >60 >60 mL/min  ? Anion gap 8 5 - 15  ? ? ? ?MAU Course  ?Procedures ? ?MDM ?Patient has had tylenol and ibuprofen for her headache. She reports that her headache is now 1/10. Suggested that procardia could be causing headaches as this is a common side effect. Plan to continue procardia for now, treat headaches as needed. FU at BP check.  ? ?Assessment and Plan  ? ?1. Postpartum state   ?2. Drug-induced headache, not elsewhere classified, not intractable   ? ?DC home ?Comfort measures reviewed  ?Postpartum preeclampsia warning signs reviewed  ?RX: no new RX  ?Return to MAU as needed ?FU with OB as planned ? ? Follow-up Information   ? ? [facility].   ?Contact information: ?[address] ?[place] [id number] ?[phone] ? ?  ?  ? ?  ?  ? ?  ? ?Thressa ShellerHeather Steven Basso DNP, CNM  ?06/04/21  11:34 PM  ? ? ?

## 2021-06-04 NOTE — MAU Note (Signed)
I delivered 2 days ago and went home yesterday. They started me on B/P meds which I took this am. I have had a headache for 2 hours. Has not taken anything for it. B/P at home was 144/84. I feel "groggy" and alittel dizzy. I called and they told me to come in and get checked out. No visual changes or epigastric pain.  ?

## 2021-06-05 MED FILL — Acetaminophen-Caffeine Tab 500-65 MG: ORAL | Qty: 1 | Status: AC

## 2021-06-10 ENCOUNTER — Ambulatory Visit (INDEPENDENT_AMBULATORY_CARE_PROVIDER_SITE_OTHER): Payer: BC Managed Care – PPO

## 2021-06-10 ENCOUNTER — Other Ambulatory Visit: Payer: Self-pay

## 2021-06-10 VITALS — BP 124/77 | HR 85

## 2021-06-10 DIAGNOSIS — Z0130 Encounter for examination of blood pressure without abnormal findings: Secondary | ICD-10-CM

## 2021-06-10 NOTE — Progress Notes (Signed)
Patient was assessed and managed by nursing staff during this encounter. I have reviewed the chart and agree with the documentation and plan. I have also made any necessary editorial changes. ? ?Mora Bellman, MD ?06/10/2021 11:45 AM  ? ?

## 2021-06-10 NOTE — Progress Notes (Signed)
..  Subjective:  ?Cindy Fletcher is a 26 y.o. female here for BP check.  ? ?Hypertension ROS: taking medications as instructed, no medication side effects noted, no TIA's, no chest pain on exertion, no dyspnea on exertion, and no swelling of ankles.  ? ? ?Objective:  ?BP 124/77   Pulse 85   ?Appearance alert, well appearing, and in no distress. ?General exam BP noted to be well controlled today in office.  ? ? ?Assessment:   ?Blood Pressure well controlled.  ? ?Plan:  ?Current treatment plan is effective, no change in therapy.Marland Kitchen ?Pt is scheduled for pp visit on 07/17/21. ? ?

## 2021-07-03 ENCOUNTER — Telehealth: Payer: Self-pay | Admitting: Emergency Medicine

## 2021-07-03 NOTE — Telephone Encounter (Signed)
Verdis Frederickson, RN from [facility] called to office reporting patient report anxiety, depression with an  Edinburgh---15. Verdis Frederickson, RN states that patient denies suicidal ideation.  ?This RN advised for patient to seek care at [facility] health Urgent Care. Verdis Frederickson RN stated that patient is not acute and  has a hx of anxiety and depression that has been exacerbated in the postpartum period. State that patient is not interested in going to [facility]. ? ?Patient has been scheduled for virtual visit with office on 4/21. Attempted call but no answer. LVM with appointment time and date. ?

## 2021-07-05 ENCOUNTER — Encounter: Payer: Self-pay | Admitting: Obstetrics & Gynecology

## 2021-07-05 ENCOUNTER — Telehealth (INDEPENDENT_AMBULATORY_CARE_PROVIDER_SITE_OTHER): Payer: BC Managed Care – PPO | Admitting: Obstetrics & Gynecology

## 2021-07-05 DIAGNOSIS — F53 Postpartum depression: Secondary | ICD-10-CM

## 2021-07-05 DIAGNOSIS — Z8632 Personal history of gestational diabetes: Secondary | ICD-10-CM

## 2021-07-05 MED ORDER — SERTRALINE HCL 50 MG PO TABS: 50.0000 mg | ORAL_TABLET | Freq: Every day | ORAL | 2 refills | Status: DC

## 2021-07-05 NOTE — Progress Notes (Signed)
? ?OBSTETRICS POSTPARTUM VIRTUAL VISIT ENCOUNTER NOTE ? ?Provider location: Center for [facility] [place]  ? ?Patient location: Home ? ?I connected with Cindy Fletcher on 07/05/21 at  8:35 AM EDT by MyChart Video Encounter and verified that I am speaking with the correct person using two identifiers. I discussed the limitations, risks, security and privacy concerns of performing an evaluation and management service virtually and the availability of in person appointments. I also discussed with the patient that there may be a patient responsible charge related to this service. The patient expressed understanding and agreed to proceed. ? ?Subjective:  ?Cindy Fletcher is a 26 y.o. [phone] female who presents for a postpartum visit. She is 4 weeks postpartum following a normal spontaneous vaginal delivery.  I have fully reviewed the prenatal and intrapartum course; intrapartum course complicated by [facility] and GDM. The delivery was at 37.3 gestational weeks.  Anesthesia: epidural. Postpartum course has been uncomplicated. Baby is doing well. Baby is feeding by breast. Bleeding no bleeding. Bowel function is normal. Bladder function is normal. Patient is not sexually active. Contraception method is abstinence.  ? ?Postpartum depression screening: positive, score 18.  Patient denies SI/HI.  ? ?The pregnancy intention screening data noted above was reviewed. Potential methods of contraception were discussed. The patient elected to proceed with progestin IUD. ? ? Edinburgh Postnatal Depression Scale - 07/05/21 0828   ? ?  ? Edinburgh Postnatal Depression Scale:  In the Past 7 Days  ? I have been able to laugh and see the funny side of things. 1   ? I have looked forward with enjoyment to things. 1   ? I have blamed myself unnecessarily when things went wrong. 3   ? I have been anxious or worried for no good reason. 3   ? I have felt scared or panicky for no good reason. 2   ? Things have been getting  on top of me. 2   ? I have been so unhappy that I have had difficulty sleeping. 2   ? I have felt sad or miserable. 2   ? I have been so unhappy that I have been crying. 2   ? The thought of harming myself has occurred to me. 0   ? Edinburgh Postnatal Depression Scale Total 18   ? ?  ?  ? ?  ? ? ?Health Maintenance Due  ?Topic Date Due  ? COVID-19 Vaccine (1) Never done  ? URINE MICROALBUMIN  Never done  ? HPV VACCINES (1 - 2-dose series) Never done  ? ? ?The following portions of the patient's history were reviewed and updated as appropriate: allergies, current medications, past family history, past medical history, past social history, past surgical history, and problem list. ? ?Review of Systems ?Pertinent items noted in HPI and remainder of comprehensive ROS otherwise negative. ? ?Objective:  ?There were no vitals taken for this visit.  ?General:  Alert, oriented and cooperative. Patient is in no acute distress.  ?Respiratory: Normal respiratory effort, no problems with respiration noted  ?Mental Status: Normal mood and affect during call today. Normal behavior. Normal judgment and thought content.  ?Rest of physical exam deferred due to type of encounter ?     ?Assessment:  ? ?Postpartum exam, complicated by postpartum depression.  ? ?Plan:  ? ?Essential components of care per ACOG recommendations: ? ?1.  Mood and well being: Patient with positive depression screening today. Reviewed local resources for support.  Reviewed treatment options,  she desires medication today.  Patient verbally consented to [facility] services about presenting concerns and psychiatric consultation as appropriate. ?- sertraline (ZOLOFT) 50 MG tablet; Take 1 tablet (50 mg total) by mouth daily.  Dispense: 30 tablet; Refill: 2 ?- Amb ref to [facility] ?- Patient tobacco use? No.   ?- hx of drug use? No.   ? ?2. Infant care and feeding:  ?-Patient currently breastmilk feeding? Yes. Reviewed  importance of draining breast regularly to support lactation.  ?-Social determinants of health (SDOH) reviewed in EPIC. No concerns ? ?3. Sexuality, contraception and birth spacing ?- Patient does not want a pregnancy in the next year.  Desired family size is 4 children.  ?- Reviewed reproductive life planning. Discussed birth spacing of 18 months. Reviewed contraceptive methods based on pt preferences and effectiveness.  Patient desired hormonal IUD or IUS, to be placed soon. Appointment will be made for patient.   ? ? ?4. Sleep and fatigue ?-Encouraged family/partner/community support of 4 hrs of uninterrupted sleep to help with mood and fatigue ? ?5. Physical Recovery  ?- Discussed patients delivery and complications. She describes her labor as good. ?- Patient had a Vaginal, no problems at delivery. Patient had a  none  laceration. Perineal healing reviewed. Patient expressed understanding ?- Patient has urinary incontinence? No. ?- Patient is not safe to resume physical and sexual activity, until six weeks postpartum ? ?6.  Health Maintenance ?- HM due items addressed Yes ?- Last pap smear  ?Diagnosis  ?Date Value Ref Range Status  ?11/07/2019 (A)  Final  ? - Atypical squamous cells of undetermined significance (ASC-US)  ? Negative HRHPV. ?Pap smear can be repeated during next visit. ?-Breast Cancer screening indicated? No.  ? ?7. Gestational Diabetes ?- Glucose tolerance, 2 hours; Future ?- PCP follow up ? ?8. CHTN ?- Stable BP at home, no longer taking medications ?- Follow up with PCP as needed ? ?I discussed the assessment and treatment plan with the patient. The patient was provided an opportunity to ask questions and all were answered. The patient agreed with the plan and demonstrated an understanding of the instructions. The patient was advised to call back or seek an in-person office evaluation/go to MAU at [facility] for any urgent or concerning symptoms. ? ?Please refer to After Visit  Summary for other counseling recommendations.  ? ?I provided 20 minutes of face-to-face time during this encounter. ? ?Future Appointments  ?Date Time Provider Department Center  ?07/17/2021  9:00 AM [facility] None  ?07/17/2021 10:55 AM Marny Lowenstein, PA-C [facility] None  ? ? ?Jaynie Collins, MD ?Center for [facility], [facility] ? ?

## 2021-07-17 ENCOUNTER — Other Ambulatory Visit (HOSPITAL_COMMUNITY)
Admission: RE | Admit: 2021-07-17 | Discharge: 2021-07-17 | Disposition: A | Discharge status: Home / Self Care | Payer: BC Managed Care – PPO | Source: Ambulatory Visit | Attending: Medical | Admitting: Medical

## 2021-07-17 ENCOUNTER — Ambulatory Visit (INDEPENDENT_AMBULATORY_CARE_PROVIDER_SITE_OTHER): Payer: BC Managed Care – PPO | Admitting: Medical

## 2021-07-17 ENCOUNTER — Encounter: Payer: Self-pay | Admitting: Medical

## 2021-07-17 ENCOUNTER — Ambulatory Visit (INDEPENDENT_AMBULATORY_CARE_PROVIDER_SITE_OTHER): Payer: BC Managed Care – PPO | Admitting: Licensed Clinical Social Worker

## 2021-07-17 ENCOUNTER — Encounter: Payer: Self-pay | Admitting: Obstetrics

## 2021-07-17 ENCOUNTER — Other Ambulatory Visit: Payer: BC Managed Care – PPO

## 2021-07-17 VITALS — BP 141/91 | HR 82 | Ht 68.5 in | Wt 253.0 lb

## 2021-07-17 DIAGNOSIS — Z124 Encounter for screening for malignant neoplasm of cervix: Secondary | ICD-10-CM

## 2021-07-17 DIAGNOSIS — Z8632 Personal history of gestational diabetes: Secondary | ICD-10-CM

## 2021-07-17 DIAGNOSIS — Z30430 Encounter for insertion of intrauterine contraceptive device: Secondary | ICD-10-CM | POA: Diagnosis not present

## 2021-07-17 DIAGNOSIS — F53 Postpartum depression: Secondary | ICD-10-CM

## 2021-07-17 DIAGNOSIS — I10 Essential (primary) hypertension: Secondary | ICD-10-CM

## 2021-07-17 LAB — POCT URINE PREGNANCY: Preg Test, Ur: NEGATIVE

## 2021-07-17 MED ORDER — LEVONORGESTREL 20.1 MCG/DAY IU IUD
1.0000 | INTRAUTERINE_SYSTEM | Freq: Once | INTRAUTERINE | Status: AC
  Administered 2021-07-17: 1 via INTRAUTERINE

## 2021-07-17 NOTE — BH Specialist Note (Signed)
Integrated Behavioral Health Initial In-Person Visit ? ?MRN: [id number] ?Name: Cindy Fletcher ? ?Number of Integrated Behavioral Health Clinician visits:  ?Session Start time:   1130am ?Session End time: 1152am ?Total time in minutes: 22 mins in person at [facility]  ? ?Types of Service: Individual psychotherapy ? ?Interpretor:No. Interpretor Name and Language: none ? ? Warm Hand Off Completed. ? ?  ? ?  ? ? ?Subjective: ?Cindy Fletcher is a 26 y.o. female accompanied by Partner/Significant Other and Father of child ?Patient was referred by Cindy Fletcher for postpartum depression. ?Patient reports the following symptoms/concerns: depressed mood, difficulty sleeping, loss of interest  ?Duration of problem: approx 9 months; Severity of problem: mild ? ?Objective: ?Mood: good and Affect: Appropriate ?Risk of harm to self or others: No plan to harm self or others ? ?Life Context: ?Family and Social: Lives with partner in [place]/relocated from [place] approx 2 years ago  ?School/Work: work at home  ?Self-Care: n/a ?Life Changes: Newborn ? ?Patient and/or Family's Strengths/Protective Factors: ?Concrete supports in place (healthy food, safe environments, etc.) ? ?Goals Addressed: ?Patient will: ?Reduce symptoms of: depression ?Increase knowledge and/or ability of: coping skills  ?Demonstrate ability to: Increase healthy adjustment to current life circumstances ? ?Progress towards Goals: ?Ongoing ? ?Interventions: ?Interventions utilized: Motivational Interviewing  ?Standardized Assessments completed: PHQ 9 ? ?Patient and/or Family Response: Cindy Fletcher responded well to visit. Cindy Fletcher reports difficulty sleeping due to breastfeeding every two hours; newborn son does not like taking the bottle. Partner is very supportive. Cindy Fletcher reports immediate family lives in in [place].  ? ? ?Assessment: ?Patient currently experiencing postpartum depression . ?  ?Patient may benefit from integrated behavioral  health. ? ?Plan: ?Follow up with behavioral health clinician on : 07/31/2021 ?Behavioral recommendations: Consult with pediatrician regarding bottle feeding, rest when children are resting, engage in self care, delegate task to prevent burnout.  ?Referral(s): Integrated [employer] (In Clinic) ?"From scale of 1-10, how likely are you to follow plan?":   ? ?Cindy Saxon, LCSW ? ? ? ? ? ? ? ? ?

## 2021-07-17 NOTE — Progress Notes (Addendum)
26 y.o GYN presents for IUD insertion. ? ? ?UPT today is Negative ? ?Administrations This Visit   ? ? levonorgestrel (LILETTA) 20.1 MCG/DAY IUD 1 each   ? ? Admin Date ?07/17/2021 Action ?Given Dose ?1 each Route ?Intrauterine Administered By ?Maretta Bees, RMA  ? ?  ?  ? ?  ?  ?

## 2021-07-17 NOTE — Progress Notes (Signed)
? ?  GYNECOLOGY CLINIC PROCEDURE NOTE ? ?Ms. Cindy Fletcher is a 26 y.o. [phone] here for Liletta IUD insertion. No GYN concerns.  Last pap smear was in 2021 and was ASCUS with negative HRHPV. She is due for pap smear today and it was obtained. ? ?IUD Insertion Procedure Note ?Patient identified, informed consent performed.  Discussed risks of irregular bleeding, cramping, infection, malpositioning or misplacement of the IUD outside the uterus which may require further procedure such as laparoscopy. Time out was performed.  Urine pregnancy test negative. ? ?Speculum placed in the vagina.  Cervix visualized.  Cleaned with Betadine x 2.  Grasped anteriorly with a single tooth tenaculum.  Uterus sounded to 8 cm.  Liletta IUD placed per manufacturer's recommendations.  Strings trimmed to 3 cm. Tenaculum was removed, good hemostasis noted.  Patient tolerated procedure well.  ? ?Patient was given post-procedure instructions.  She was advised to be have backup contraception for one week.  Patient was also asked to check IUD strings periodically and follow up in 4 weeks for IUD check. ? ?Marny Lowenstein, PA-C ?07/17/2021 11:49 AM  ? ?

## 2021-07-18 ENCOUNTER — Encounter: Payer: Self-pay | Admitting: Obstetrics & Gynecology

## 2021-07-18 LAB — GLUCOSE TOLERANCE, 2 HOURS
Glucose, 2 hour: 108 mg/dL (ref 70–139)
Glucose, GTT - Fasting: 92 mg/dL (ref 70–99)

## 2021-07-22 LAB — CYTOLOGY - PAP: Diagnosis: REACTIVE

## 2021-07-31 ENCOUNTER — Ambulatory Visit (INDEPENDENT_AMBULATORY_CARE_PROVIDER_SITE_OTHER): Payer: BC Managed Care – PPO | Admitting: Licensed Clinical Social Worker

## 2021-07-31 ENCOUNTER — Telehealth: Payer: Self-pay | Admitting: Licensed Clinical Social Worker

## 2021-07-31 DIAGNOSIS — F53 Postpartum depression: Secondary | ICD-10-CM

## 2021-07-31 NOTE — Telephone Encounter (Signed)
Called pt regarding scheduled appt. Left message  

## 2021-08-01 NOTE — BH Specialist Note (Signed)
Integrated Behavioral Health via Telemedicine Visit  08/01/2021 Cindy Fletcher [id number]  Number of Integrated Behavioral Health Clinician visits: 3 Session Start time:  1:00pm Session End time: 1:52pm Total time in minutes: 52 mins via mychart video  Referring Provider: Harlon Flor  Patient/Family location: Home  [facility] Provider location: [facility]  All persons participating in visit: Cindy Fletcher and LCSW Cindy Fletcher Types of Service: Individual psychotherapy  I connected with Cindy Fletcher and/or Cindy Fletcher n/a via  Telephone or Video Enabled Telemedicine Application  (Video is Caregility application) and verified that I am speaking with the correct person using two identifiers. Discussed confidentiality: Yes   I discussed the limitations of telemedicine and the availability of in person appointments.  Discussed there is a possibility of technology failure and discussed alternative modes of communication if that failure occurs.  I discussed that engaging in this telemedicine visit, they consent to the provision of behavioral healthcare and the services will be billed under their insurance.  Patient and/or legal guardian expressed understanding and consented to Telemedicine visit: Yes   Presenting Concerns: Patient and/or family reports the following symptoms/concerns: postpartum depression Duration of problem: approx 9 months ; Severity of problem: mild  Patient and/or Family's Strengths/Protective Factors: Concrete supports in place (healthy food, safe environments, etc.)  Goals Addressed: Patient will:  Reduce symptoms of: depression   Increase knowledge and/or ability of: coping skills   Demonstrate ability to: Increase healthy adjustment to current life circumstances  Progress towards Goals: Ongoing  Interventions: Interventions utilized:  Supportive Counseling Standardized Assessments completed: PHQ 9  Patient and/or Family Response: Cindy Fletcher responded well to visit  Assessment: Patient currently experiencing postpartum depression.   Patient may benefit from integrated behavioral health.  Plan: Follow up with behavioral health clinician on : 08/14/2021 Behavioral recommendations: Consult with pediatrician regarding bottle feeding, rest when children are resting, engage in self care, delegate task to prevent burnout.  Referral(s): Integrated [employer] (In Clinic)  I discussed the assessment and treatment plan with the patient and/or parent/guardian. They were provided an opportunity to ask questions and all were answered. They agreed with the plan and demonstrated an understanding of the instructions.   They were advised to call back or seek an in-person evaluation if the symptoms worsen or if the condition fails to improve as anticipated.  [name], LCSW

## 2021-08-14 ENCOUNTER — Ambulatory Visit (INDEPENDENT_AMBULATORY_CARE_PROVIDER_SITE_OTHER): Payer: BC Managed Care – PPO | Admitting: Women's Health

## 2021-08-14 ENCOUNTER — Ambulatory Visit (INDEPENDENT_AMBULATORY_CARE_PROVIDER_SITE_OTHER): Payer: BC Managed Care – PPO | Admitting: Licensed Clinical Social Worker

## 2021-08-14 ENCOUNTER — Telehealth: Payer: Self-pay | Admitting: Licensed Clinical Social Worker

## 2021-08-14 VITALS — BP 126/76 | HR 80 | Ht 68.5 in | Wt 251.0 lb

## 2021-08-14 DIAGNOSIS — Z30431 Encounter for routine checking of intrauterine contraceptive device: Secondary | ICD-10-CM

## 2021-08-14 DIAGNOSIS — F53 Postpartum depression: Secondary | ICD-10-CM | POA: Diagnosis not present

## 2021-08-14 MED ORDER — VITAFOL GUMMIES 3.33-0.333-34.8 MG PO CHEW: 3.0000 | CHEWABLE_TABLET | Freq: Every day | ORAL | 12 refills | Status: AC

## 2021-08-14 NOTE — Progress Notes (Signed)
GYNECOLOGY OFFICE VISIT NOTE  History:  26 y.o. [id number] here today for IUD string check. Liletta IUD was placed on 07/17/2021. She denies any abnormal vaginal discharge, pelvic pain or other concerns. Having spotting/bleeding. Pt has not had intercourse since insertion.  Past Medical History:  Diagnosis Date   Abnormal Pap smear of cervix    Anemia    Asthma    last used inhaler 8y ago   History of gestational diabetes 04/03/2021   Normal postpartum 2 hr GTT   Hypertension    PTSD (post-traumatic stress disorder)    Shoulder dystocia, delivered 06/02/2021    Past Surgical History:  Procedure Laterality Date   NO PAST SURGERIES      The following portions of the patient's history were reviewed and updated as appropriate: allergies, current medications, past family history, past medical history, past social history, past surgical history and problem list.   Review of Systems:  Pertinent items noted in HPI and remainder of comprehensive ROS otherwise negative.  Objective:  Physical Exam BP 126/76   Pulse 80   Ht 5' 8.5" (1.74 m)   Wt 251 lb (113.9 kg)   LMP 07/22/2021 Comment: Bleeding since IUD placed  Breastfeeding Unknown   BMI 37.61 kg/m   CONSTITUTIONAL: Well-developed, well-nourished female in no acute distress.  HENT:  Normocephalic, atraumatic.  SKIN: Skin is warm and dry. No rash noted. Not diaphoretic. No erythema. No pallor. NEUROLOGIC: Alert and oriented to person, place, and time.  PSYCHIATRIC: Normal mood and affect. Normal behavior. Normal judgment and thought content. CARDIOVASCULAR: Normal heart rate noted RESPIRATORY: Effort and rate normal ABDOMEN: Soft, no distention noted.   PELVIC: Normal appearing external genitalia; normal appearing vaginal mucosa and cervix. IUD string seen. No abnormal discharge noted.    Assessment & Plan:  1. IUD check up -strings visible -IUD not palpable on bimanual exam -patient questions answered -PNV sent per pt  request  Please refer to After Visit Summary for other counseling recommendations.   Return for [employer].   Tripton Ned, Odie Sera, NP 08/14/2021 11:13 AM

## 2021-08-14 NOTE — Progress Notes (Signed)
Bleeding since IUD. Using 2 pantyliners per day. Occ more bleeding.

## 2021-08-14 NOTE — Telephone Encounter (Signed)
Called pt reminder for 815am mychart appt.

## 2021-08-15 ENCOUNTER — Telehealth: Payer: BC Managed Care – PPO | Admitting: Women's Health

## 2021-08-15 NOTE — Progress Notes (Signed)
No show.  Marylen Ponto, NP  8:53 AM 08/15/2021

## 2021-08-16 NOTE — BH Specialist Note (Signed)
Integrated Behavioral Health Follow Up In-Person Visit  MRN: [id number] Name: Cindy Fletcher  Number of Integrated Behavioral Health Clinician visits: 4 Session Start time:  11:00am Session End time: 11:41am Total time in minutes: 41 mins in person at [facility]  Types of Service: Individual psychotherapy  Interpretor:No. Interpretor Name and Language: none  Subjective: Cindy Fletcher is a 26 y.o. female accompanied by n/a Patient was referred by Dr Macon Large for postpartum depression. Patient reports the following symptoms/concerns: depressed mood, loss of interest, difficulty sleeping, and feeling overwhelmed  Duration of problem: approx 10 months ; Severity of problem: mild  Objective: Mood: Depressed and Affect: Appropriate Risk of harm to self or others: No plan to harm self or others  Life Context: Family and Social: lives with partner and children  School/Work: Work from home  Self-Care: n/a Life Changes: newborn   Patient and/or Family's Strengths/Protective Factors: Concrete supports in place (healthy food, safe environments, etc.)  Goals Addressed: Patient will:  Reduce symptoms of: depression   Increase knowledge and/or ability of: coping skills   Demonstrate ability to: Increase healthy adjustment to current life circumstances  Progress towards Goals: Ongoing  Interventions: Interventions utilized:  Supportive Counseling Standardized Assessments completed: Inocente Salles Postnatal Depression  Patient and/or Family Response: Ms. Ebling reports medication is not working. Advise Ms. [place] consult with pediatrician regarding bottle feeding therefore dad and help with feedings. Advise to prioritize rest and address stressors. Appt for 08/15/2021 @815  to consult with provider regarding medication   Assessment: Patient currently experiencing postpartum depression .   Patient may benefit from integrated behavioral health.  Plan: Follow up with behavioral  health clinician on : 08/15/2021 Behavioral recommendations:  Consult with pediatrician regarding bottle feeding, rest when children are resting, engage in self care, delegate task to prevent burnout.  Referral(s): Integrated 10/15/2021 (In Clinic) "From scale of 1-10, how likely are you to follow plan?":    [employer], LCSW

## 2021-08-20 ENCOUNTER — Encounter: Payer: Self-pay | Admitting: Advanced Practice Midwife

## 2021-08-20 ENCOUNTER — Telehealth (INDEPENDENT_AMBULATORY_CARE_PROVIDER_SITE_OTHER): Payer: BC Managed Care – PPO | Admitting: Advanced Practice Midwife

## 2021-08-20 DIAGNOSIS — F53 Postpartum depression: Secondary | ICD-10-CM

## 2021-08-20 DIAGNOSIS — J069 Acute upper respiratory infection, unspecified: Secondary | ICD-10-CM

## 2021-08-20 MED ORDER — SERTRALINE HCL 100 MG PO TABS: 100.0000 mg | ORAL_TABLET | Freq: Every day | ORAL | 11 refills | Status: AC

## 2021-08-20 NOTE — Progress Notes (Signed)
Pt seeing Sue Lush and is requesting increase of Zoloft due to postpartum depression.  Pt state she has a cold and wants to know if Dayquil severe cold and flu is ok to take while breast feeding.

## 2021-08-20 NOTE — Progress Notes (Signed)
GYNECOLOGY VIRTUAL VISIT ENCOUNTER NOTE  Provider location: [facility]   Patient location: Home  I connected with Cindy Fletcher on 08/20/21 at  1:10 PM EDT by MyChart Video Encounter and verified that I am speaking with the correct person using two identifiers.   I discussed the limitations, risks, security and privacy concerns of performing an evaluation and management service virtually and the availability of in person appointments. I also discussed with the patient that there may be a patient responsible charge related to this service. The patient expressed understanding and agreed to proceed.   History:  Cindy Fletcher is a 26 y.o. [phone] female being evaluated today for postpartum depression, desires a change in medication.  She is taking Zoloft 50 mg daily which she reports was helping but now is not helping as much. She reports little interest in her usual activities and in things that she used to enjoy.  She denies any thoughts of  harming herself or others.   She denies any any gyn concerns.     Past Medical History:  Diagnosis Date   Abnormal Pap smear of cervix    Anemia    Asthma    last used inhaler 8y ago   History of gestational diabetes 04/03/2021   Normal postpartum 2 hr GTT   Hypertension    PTSD (post-traumatic stress disorder)    Shoulder dystocia, delivered 06/02/2021   Past Surgical History:  Procedure Laterality Date   NO PAST SURGERIES     The following portions of the patient's history were reviewed and updated as appropriate: allergies, current medications, past family history, past medical history, past social history, past surgical history and problem list.   Health Maintenance:  Normal pap and negative HRHPV on 07/17/21.  Review of Systems:  Pertinent items noted in HPI and remainder of comprehensive ROS otherwise negative.  Physical Exam:   General:  Alert, oriented and cooperative. Patient appears  to be in no acute distress.  Mental Status: Normal mood and affect. Normal behavior. Normal judgment and thought content.   Respiratory: Normal respiratory effort, no problems with respiration noted  Rest of physical exam deferred due to type of encounter  Labs and Imaging No results found for this or any previous visit (from the past 336 hour(s)). No results found.     Assessment and Plan:     1. Postpartum depression --Increase to Zoloft 100 mg daily --Pt to f/u with Sue Lush with [facility]  --F/U in 3 months  - sertraline (ZOLOFT) 100 MG tablet; Take 1 tablet (100 mg total) by mouth daily.  Dispense: 30 tablet; Refill: 11  2. Viral upper respiratory infection --Reviewed pt Dayquil ingredients and they are likely safe in breastfeeding --Pt may switch to dexomethorphan only for cough if she no longer needs  the combination Dayquil cold/flu --Gargle with saltwater daily and drink plenty of water --F/U with primary care/urgent care if symptoms worsen or persist       I discussed the assessment and treatment plan with the patient. The patient was provided an opportunity to ask questions and all were answered. The patient agreed with the plan and demonstrated an understanding of the instructions.   The patient was advised to call back or seek an in-person evaluation/go to the ED if the symptoms worsen or if the condition fails to improve as anticipated.  I provided 7 minutes of face-to-face time during this encounter.   Cindy Fletcher, CNM [facility]

## 2021-09-03 ENCOUNTER — Encounter: Payer: BC Managed Care – PPO | Admitting: Licensed Clinical Social Worker

## 2021-09-26 ENCOUNTER — Encounter: Payer: Self-pay | Admitting: Advanced Practice Midwife

## 2021-12-28 IMAGING — US US MFM OB DETAIL+14 WK
1 series · 13 of 28 positions shown · non-contrast
Comparison: none

[Series 1: us mfm ob detail+14 wk · 135 acquisitions, 13 frames shown]
[im 5/135]
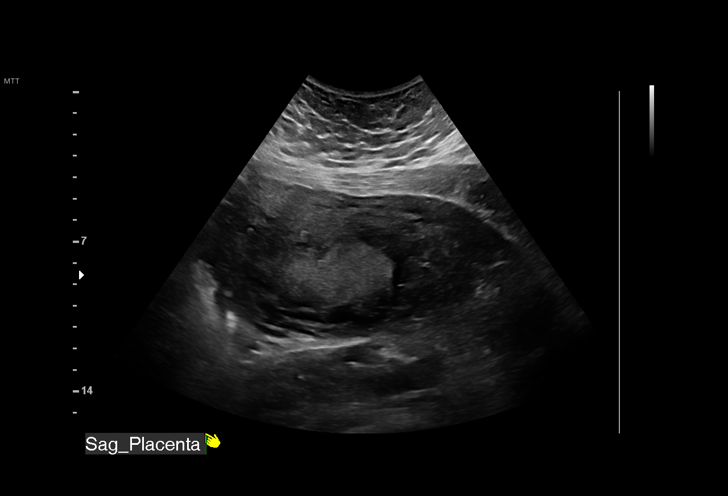
[im 15/135]
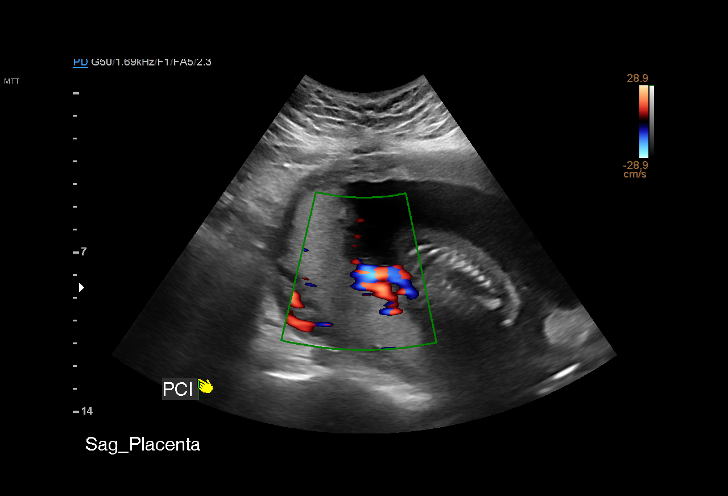
[im 25/135]
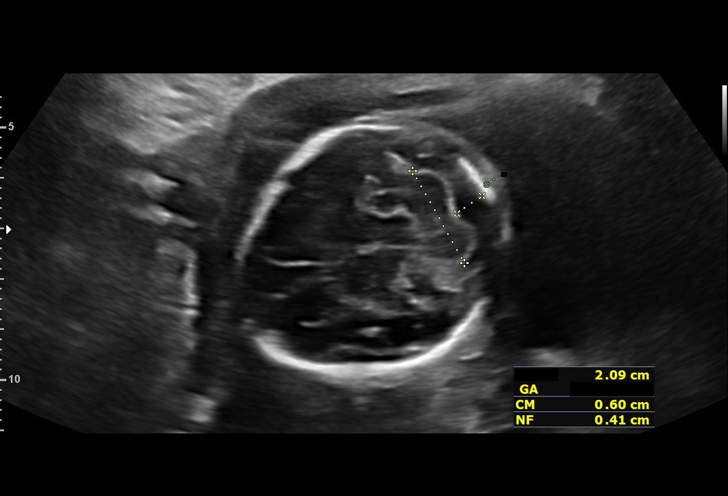
[im 35/135]
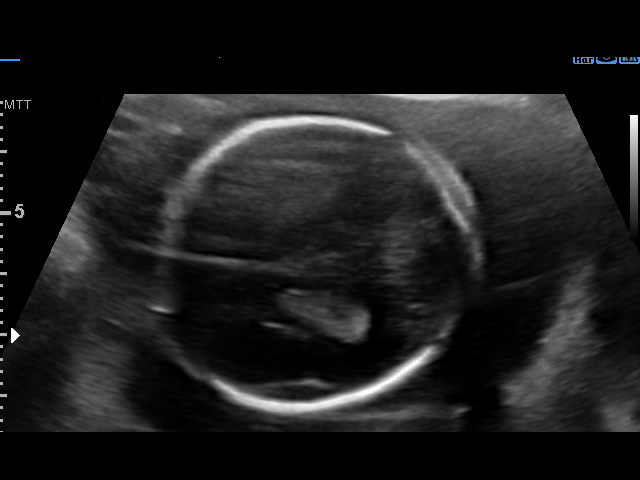
[im 45/135]
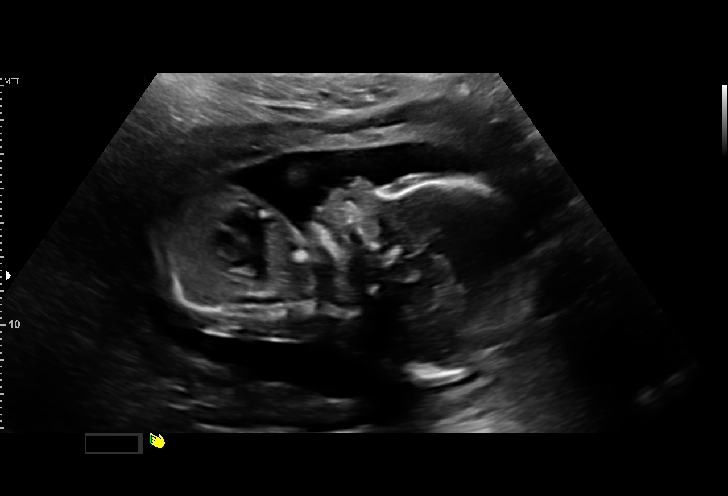
[im 55/135]
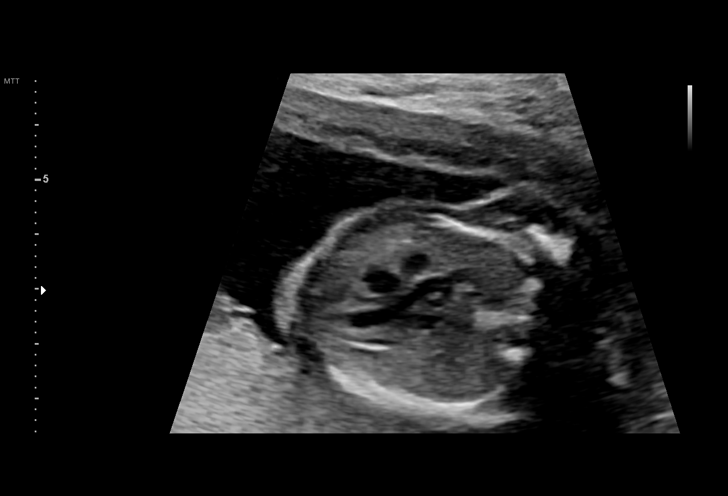
[im 70/135]
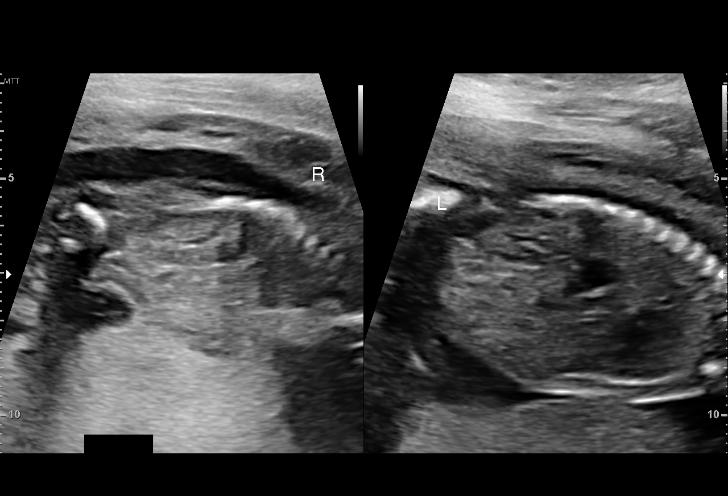
[im 80/135]
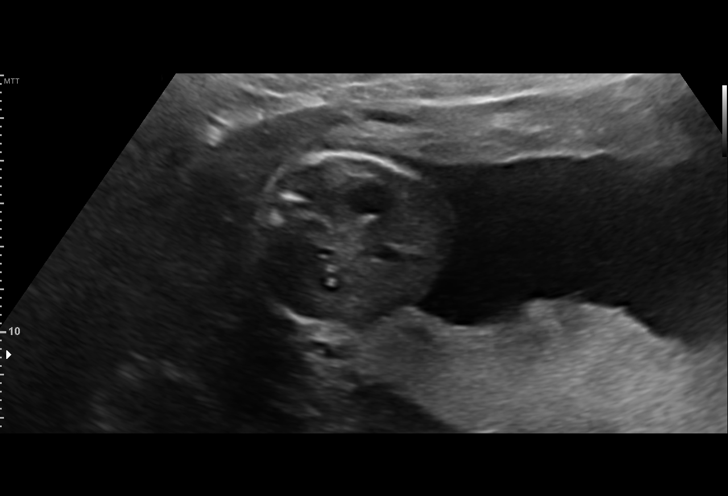
[im 90/135]
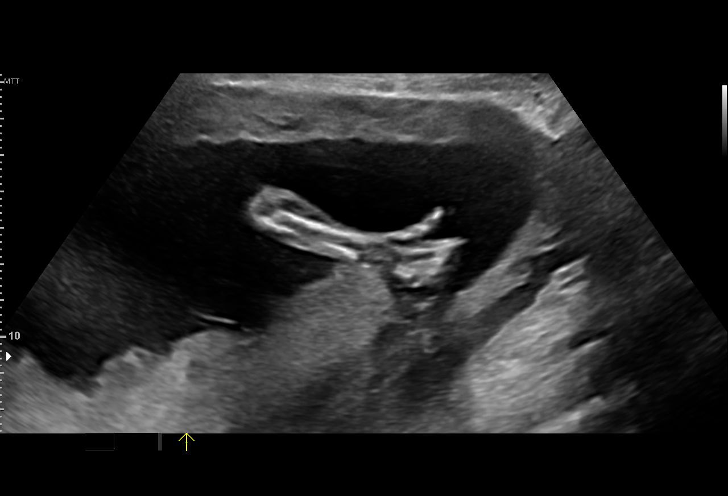
[im 100/135]
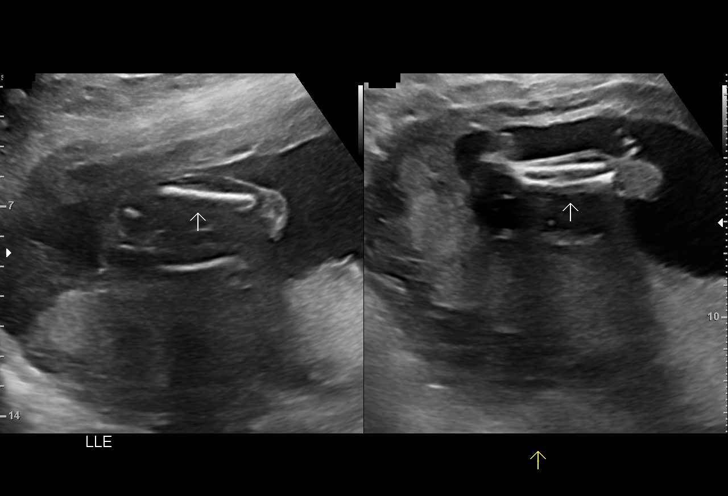
[im 110/135]
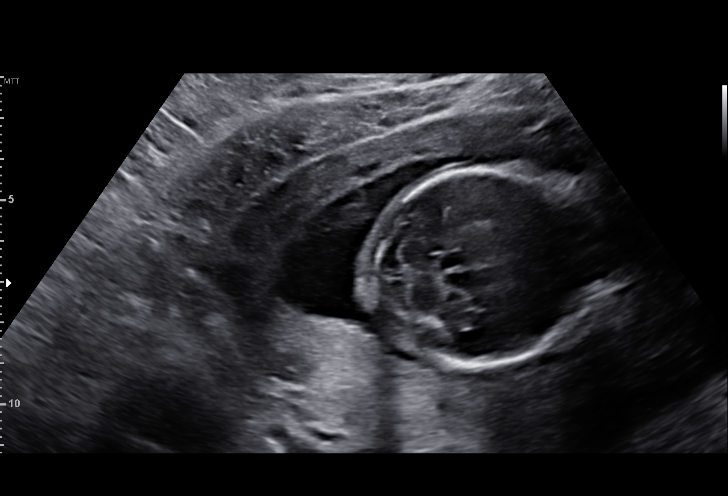
[im 120/135]
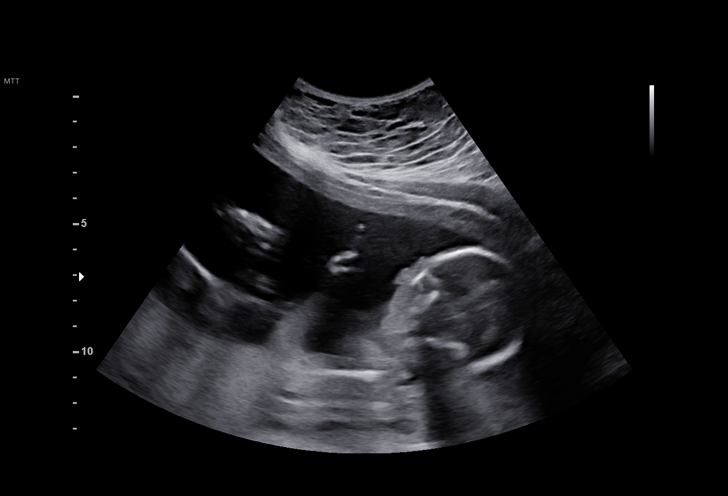
[im 130/135]
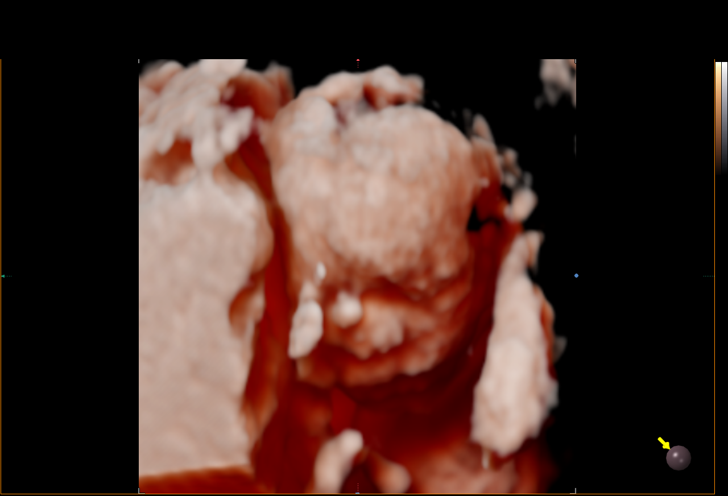

[13 of 28 positions shown; findings below may reference images not displayed]

[REDACTED]care

Indications

 Obesity complicating pregnancy, second
 trimester (BMI 33)
 20 weeks gestation of pregnancy
 Encounter for antenatal screening for
 malformations
Fetal Evaluation

 Num Of Fetuses:         1
 Fetal Heart Rate(bpm):  153
 Cardiac Activity:       Observed
 Placenta:               Posterior Left
 P. Cord Insertion:      Visualized, central

 Amniotic Fluid
 AFI FV:      Within normal limits

                             Largest Pocket(cm)

Biometry

 BPD:      48.3  mm     G. Age:  20w 4d         75  %    CI:        79.76   %    70 - 86
                                                         FL/HC:      18.5   %    16.8 -
 HC:      170.9  mm     G. Age:  19w 5d         28  %    HC/AC:      1.13        1.09 -
 AC:      151.7  mm     G. Age:  20w 3d         58  %    FL/BPD:     65.4   %
 FL:       31.6  mm     G. Age:  19w 6d         36  %    FL/AC:      20.8   %    20 - 24
 HUM:      31.3  mm     G. Age:  20w 3d         65  %
 CER:      20.9  mm     G. Age:  20w 0d         70  %
 NFT:       4.1  mm
 LV:        6.3  mm
 CM:          6  mm

 Est. FW:     332  gm    0 lb 12 oz      51  %
OB History

 Blood Type:   A+
 Gravidity:    2          SAB:   1
 Living:       0
Gestational Age

 LMP:           20w 0d        Date:  08/26/19                 EDD:   06/01/20
 U/S Today:     20w 1d                                        EDD:   05/31/20
 Best:          20w 0d     Det. By:  U/S C R L  (10/21/19)    EDD:   06/01/20
Anatomy

 Cranium:               Appears normal         Aortic Arch:            Appears normal
 Cavum:                 Appears normal         Ductal Arch:            Not well visualized
 Ventricles:            Appears normal         Diaphragm:              Appears normal
 Choroid Plexus:        Appears normal         Stomach:                Appears normal, left
                                                                       sided
 Cerebellum:            Appears normal         Abdomen:                Appears normal
 Posterior Fossa:       Appears normal         Abdominal Wall:         Appears nml (cord
                                                                       insert, abd wall)
 Nuchal Fold:           Appears normal         Cord Vessels:           Appears normal (3
                                                                       vessel cord)
 Face:                  Orbits nl; profile not Kidneys:                Appear normal
                        well visualized
 Lips:                  Appears normal         Bladder:                Appears normal
 Thoracic:              Appears normal         Spine:                  Appears normal
 Heart:                 Not well visualized    Upper Extremities:      Appears normal
 RVOT:                  Not well visualized    Lower Extremities:      Appears normal
 LVOT:                  Not well visualized

 Other:  Female gender previously seen. Heels and Right 5th digit visualized.
         Technically difficult due to maternal habitus and fetal position.
Cervix Uterus Adnexa

 Cervix
 Length:           4.88  cm.
 Normal appearance by transabdominal scan.

 Uterus
 No abnormality visualized.

 Right Ovary
 Within normal limits. No adnexal mass visualized.

 Left Ovary
 Within normal limits. No adnexal mass visualized.

 Cul De Sac
 No free fluid seen.
 Adnexa
 No abnormality visualized.
Comments

 This patient was seen for a detailed fetal anatomy scan due
 to maternal obesity.
 She denies any significant past medical history and denies
 any problems in her current pregnancy.
 She had a cell free DNA test earlier in her pregnancy which
 indicated a low risk for trisomy 21, 18, and 13. A female fetus
 is predicted.
 She was informed that the fetal growth and amniotic fluid
 level were appropriate for her gestational age.
 There were no obvious fetal anomalies noted on today's
 ultrasound exam.  However, the views of the fetal anatomy
 were limited today due to the fetal position.
 The patient was informed that anomalies may be missed due
 to technical limitations. If the fetus is in a suboptimal position
 or maternal habitus is increased, visualization of the fetus in
 the maternal uterus may be impaired.
 A follow-up exam was scheduled in 4 weeks to complete the
 views of the fetal anatomy.

## 2022-04-10 IMAGING — US US MFM OB FOLLOW-UP
1 series · 14 of 28 positions shown · non-contrast
Comparison: none

[Series 1: us mfm ob follow-up · 36 acquisitions, 14 frames shown]
[im 2/36]
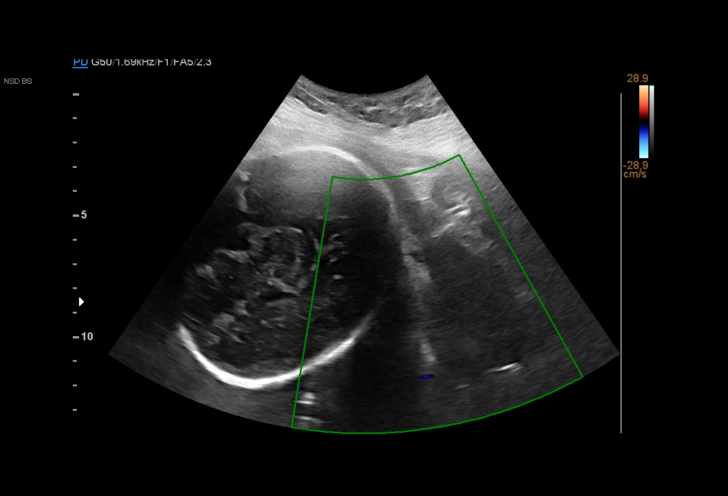
[im 4/36]
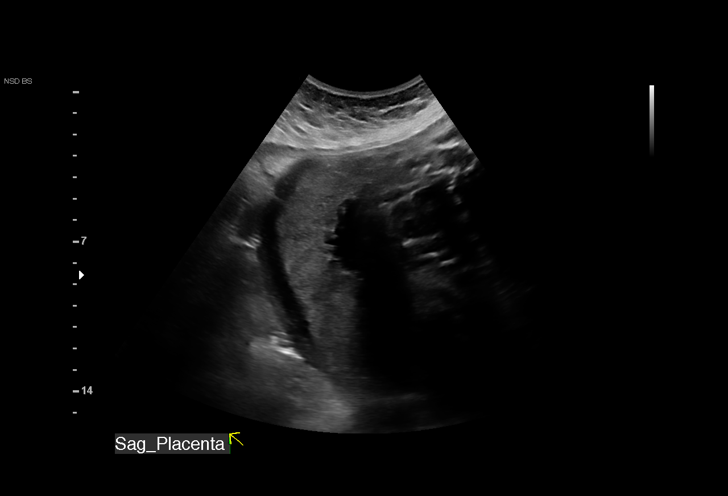
[im 7/36]
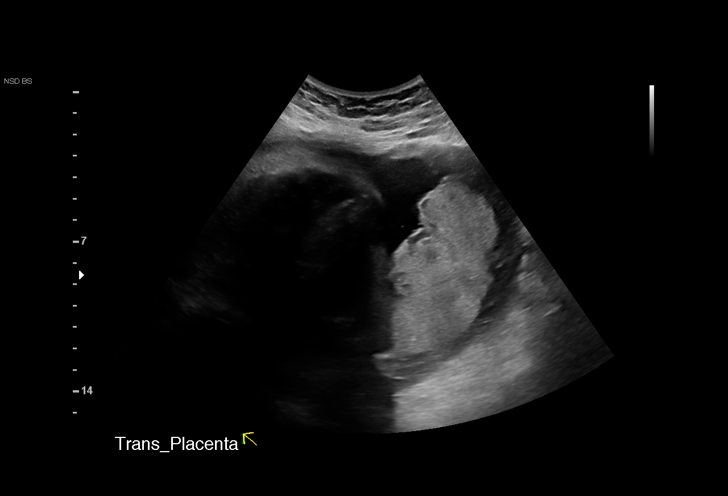
[im 10/36]
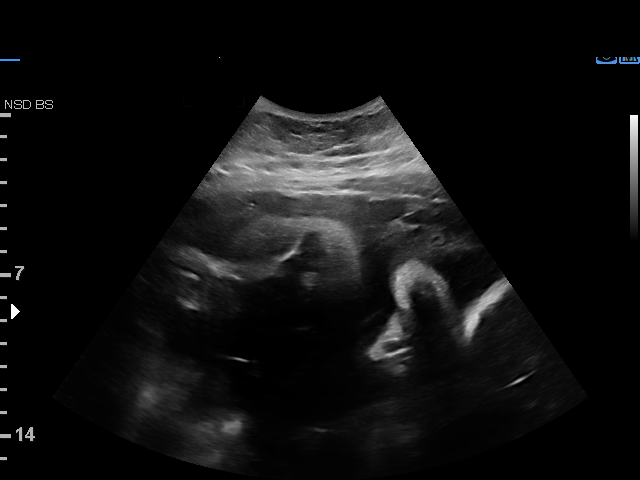
[im 12/36]
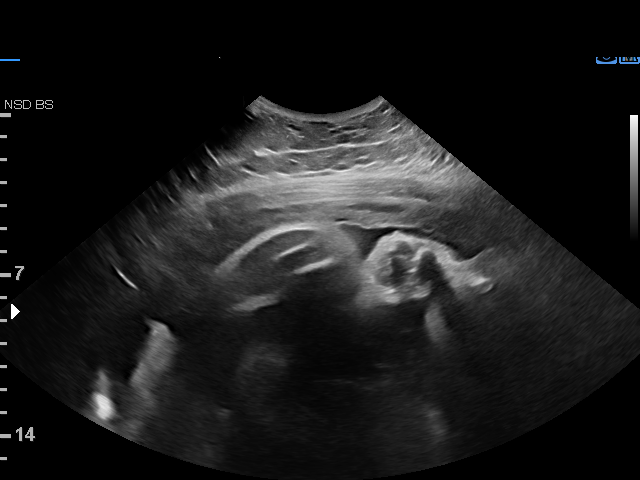
[im 15/36]
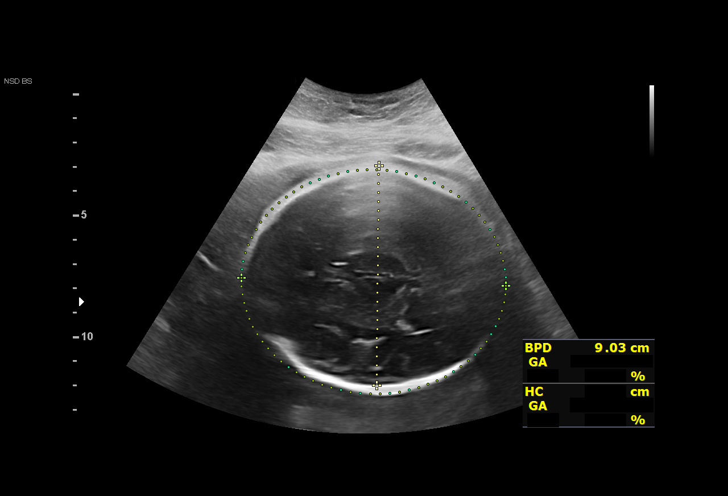
[im 17/36]
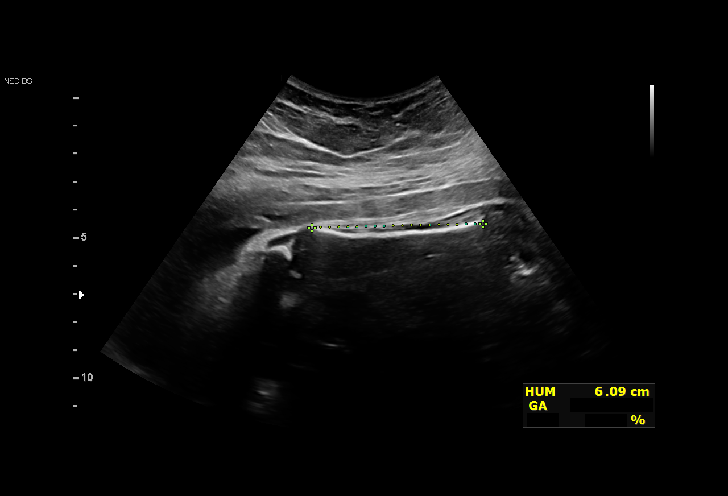
[im 20/36]
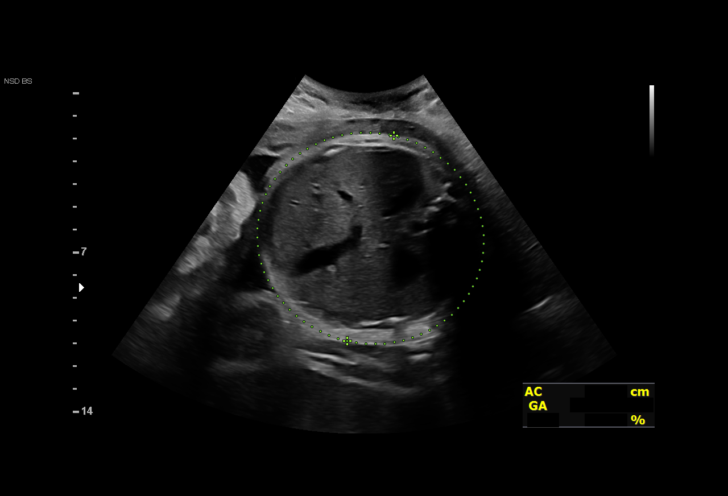
[im 23/36]
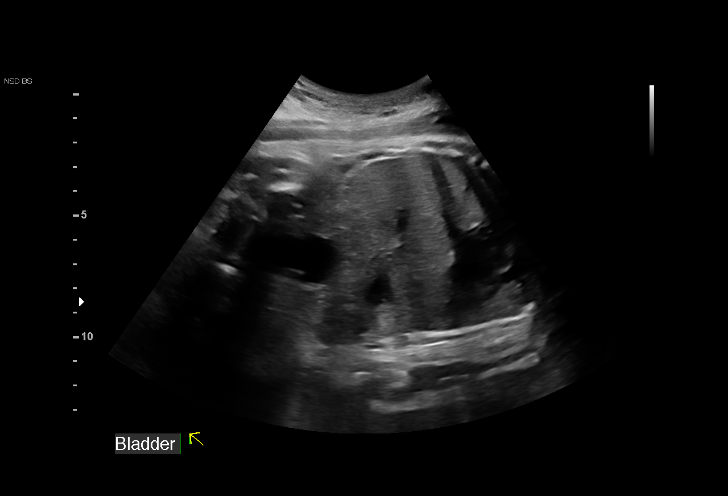
[im 25/36]
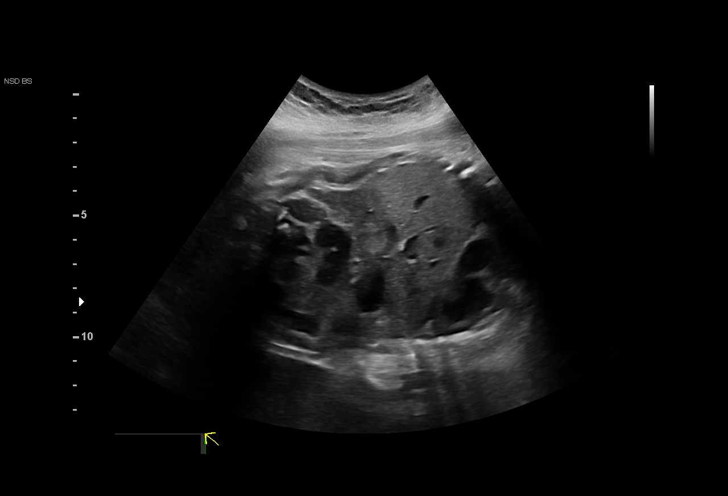
[im 28/36]
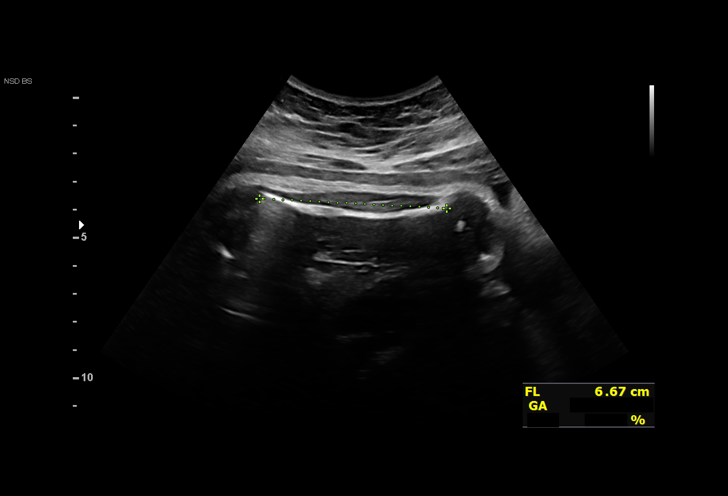
[im 30/36]
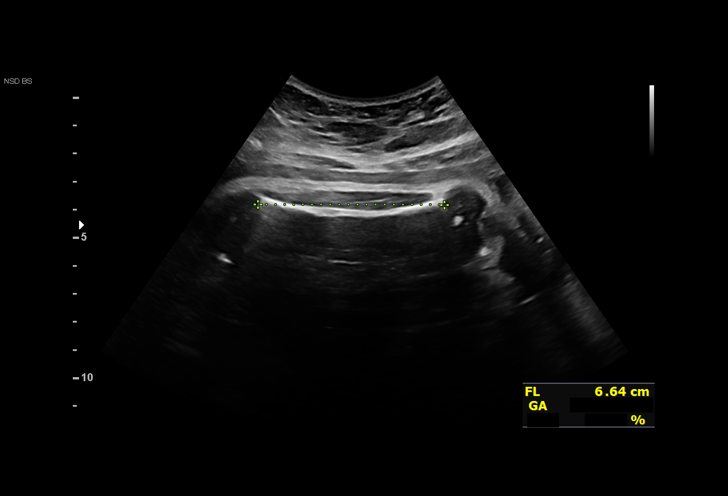
[im 33/36]
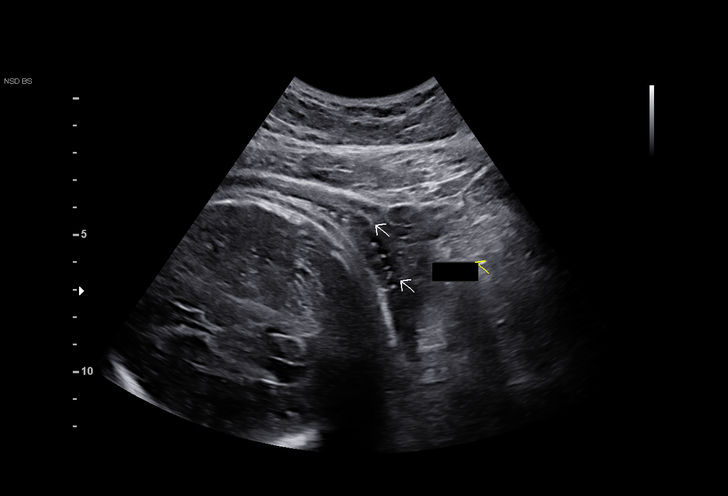
[im 36/36]
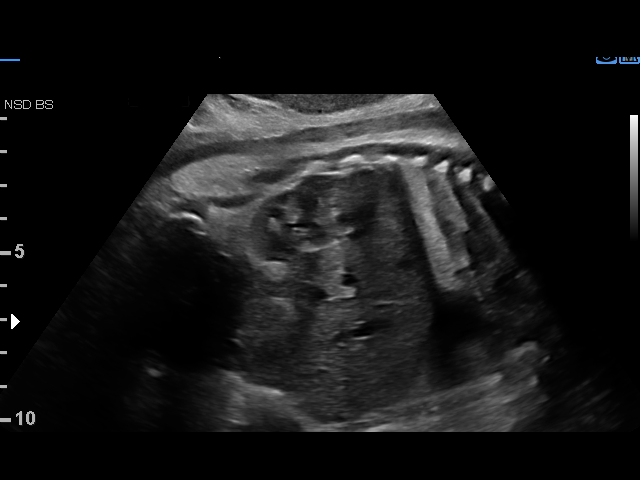

[14 of 28 positions shown; findings below may reference images not displayed]

W/NONSTRESS                                       BOYZZ

Indications

 Obesity complicating pregnancy, second
 trimester (BMI 33)
 34 weeks gestation of pregnancy
 Antenatal follow-up for nonvisualized fetal
 anatomy
 Elevated blood pressure affecting pregnancy
 in third trimester (No meds)
 Medical complication of pregnancy (Covid +
 Third trimester) started antenatal testing [REDACTED]
Fetal Evaluation

 Num Of Fetuses:         1
 Fetal Heart Rate(bpm):  144
 Cardiac Activity:       Observed
 Presentation:           Cephalic
 Placenta:               Posterior
 P. Cord Insertion:      Previously Visualized

 Amniotic Fluid
 AFI FV:      Within normal limits

 AFI Sum(cm)     %Tile       Largest Pocket(cm)
 14.73           53
 RUQ(cm)       RLQ(cm)       LUQ(cm)        LLQ(cm)


 Comment:    Breathing noted intermittently, but not sustained.
Biophysical Evaluation

 Amniotic F.V:   Within normal limits       F. Tone:        Observed
 F. Movement:    Observed                   N.S.T:          Reactive
 F. Breathing:   Not Observed               Score:          [DATE]
Biometry

 BPD:      90.3  mm     G. Age:  36w 4d         92  %    CI:        80.91   %    70 - 86
                                                         FL/HC:      21.0   %    20.1 -
 HC:       317   mm     G. Age:  35w 4d         38  %    HC/AC:      1.05        0.93 -
 AC:      302.1  mm     G. Age:  34w 1d         40  %    FL/BPD:     73.6   %    71 - 87
 FL:       66.5  mm     G. Age:  34w 2d         29  %    FL/AC:      22.0   %    20 - 24
 HUM:      60.5  mm     G. Age:  35w 1d         70  %
 LV:        5.8  mm

 Est. FW:    5789  gm      5 lb 7 oz     41  %
OB History

 Blood Type:   A+
 Gravidity:    2          SAB:   1
 Living:       0
Gestational Age

 LMP:           34w 5d        Date:  [DATE]                 EDD:   08/26/19
 U/S Today:     35w 1d                                        EDD:   06/01/20
 Best:          34w 5d     Det. By:  U/S C R L  (05/29/20)    EDD:   10/21/19
Cervix Uterus Adnexa

 Cervix
 Not visualized (advanced GA >65wks)

 Right Ovary
 Previously seen

 Left Ovary
 Previously seen.
Impression

 Chronic hypertension.  Well controlled without
 antihypertensives.  Patient is here for fetal growth
 assessment and BPP.  Blood pressure at your office
 yesterday was 143/75 mmHg.  Blood pressure today at her
 office is 135/73 mmHg

 Amniotic fluid is normal and good fetal activity is seen .Fetal
 growth is appropriate for gestational age.  Fetal breathing
 movements did not meet the criteria of BPP.  NST is reactive.

 I reassured the patient of the findings.
Recommendations

 -Continue weekly BPP till delivery.
                 Barden, [place]

## 2022-04-24 IMAGING — US US MFM FETAL BPP W/O NON-STRESS
1 series · 12 of 12 positions shown · non-contrast
Comparison: none

[Series 1: us mfm fetal bpp w/o non-stress · 12 acquisitions, 12 frames shown]
[im 1/12]
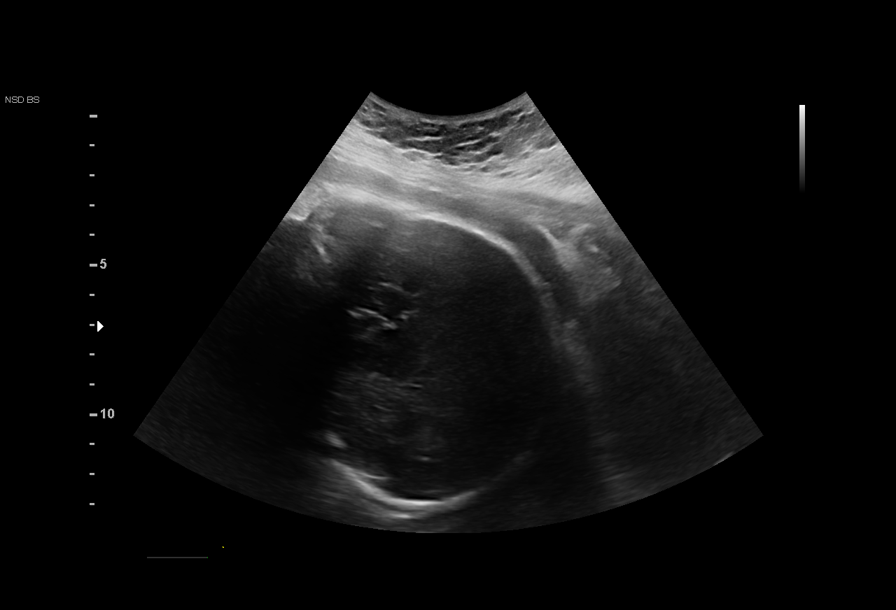
[im 2/12]
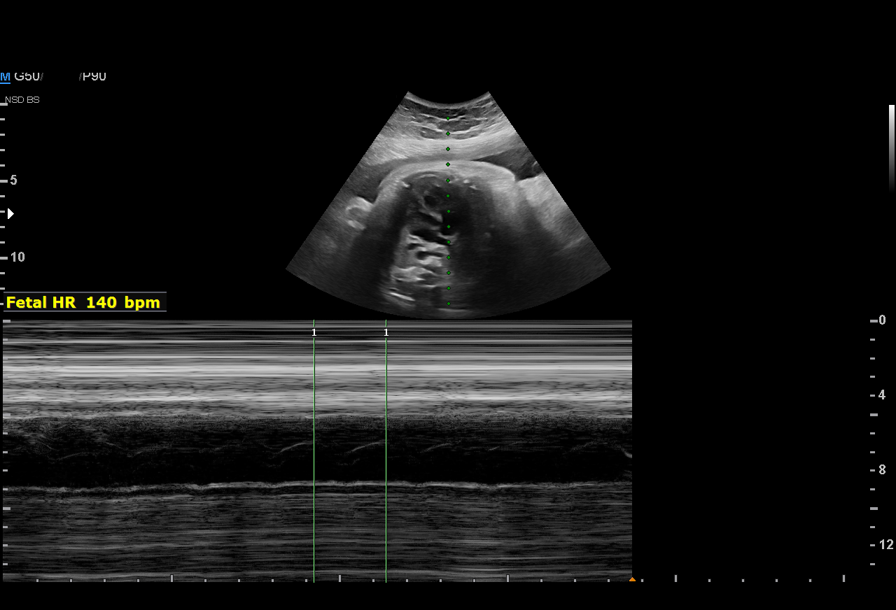
[im 3/12]
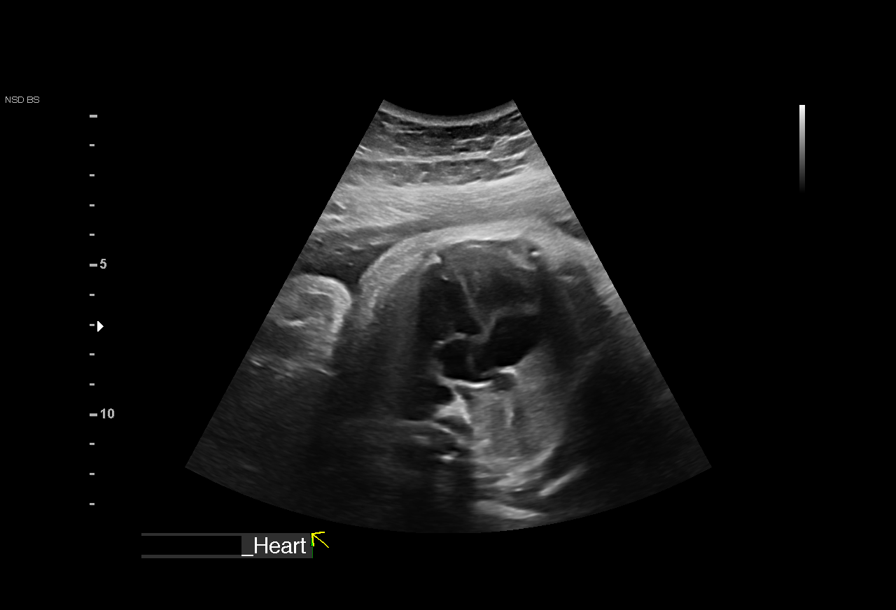
[im 4/12]
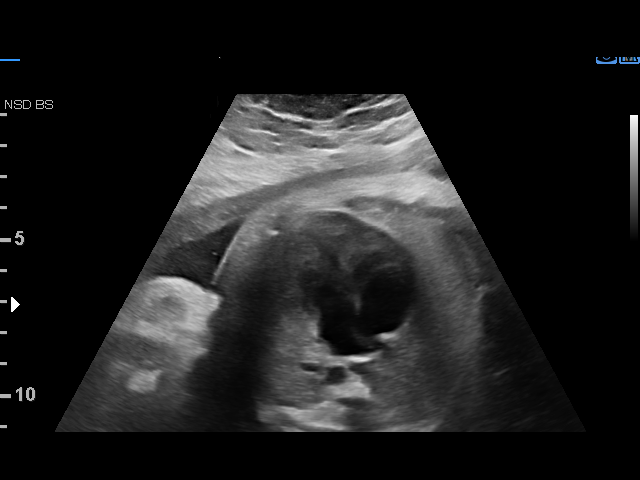
[im 5/12]
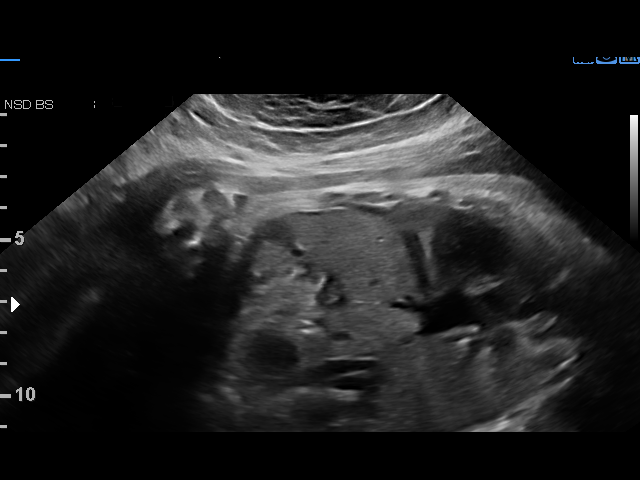
[im 6/12]
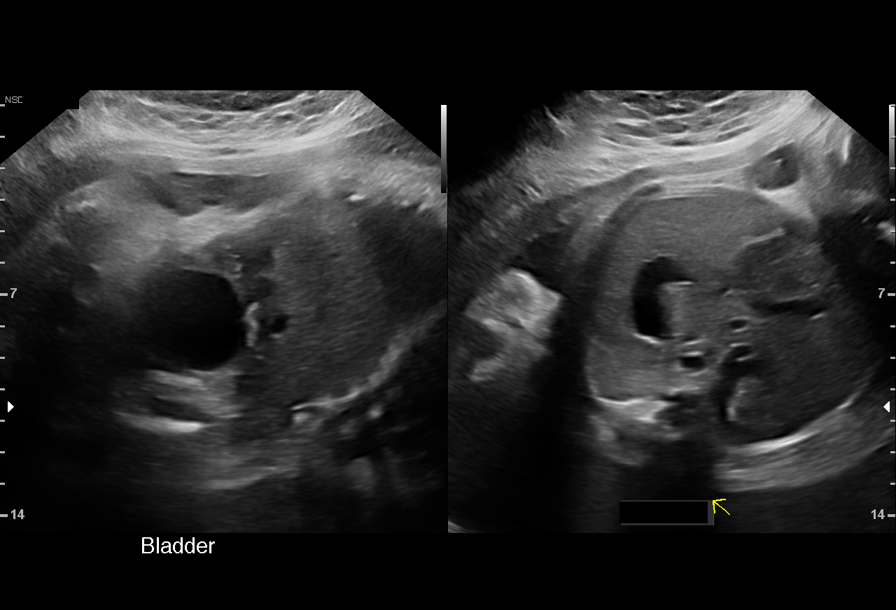
[im 7/12]
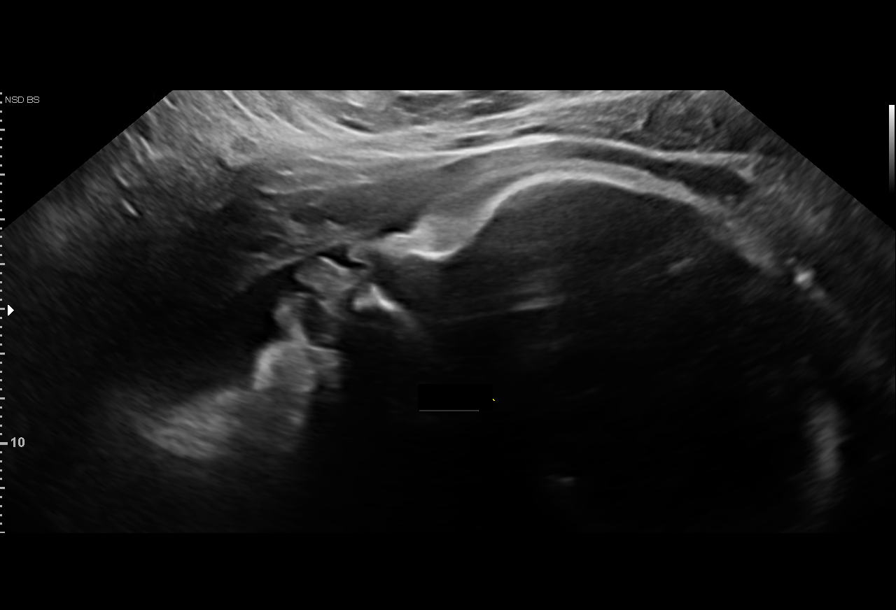
[im 8/12]
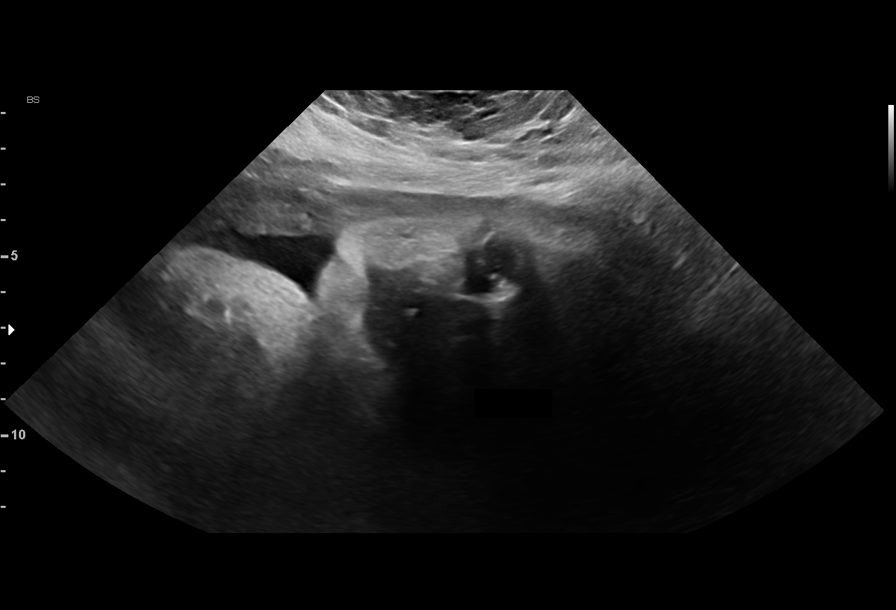
[im 9/12]
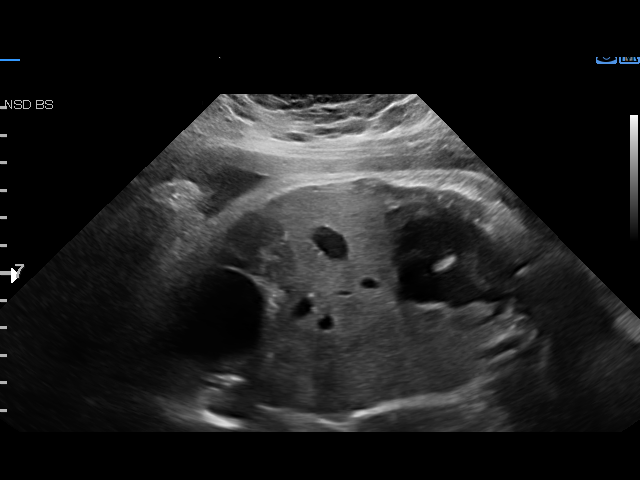
[im 10/12]
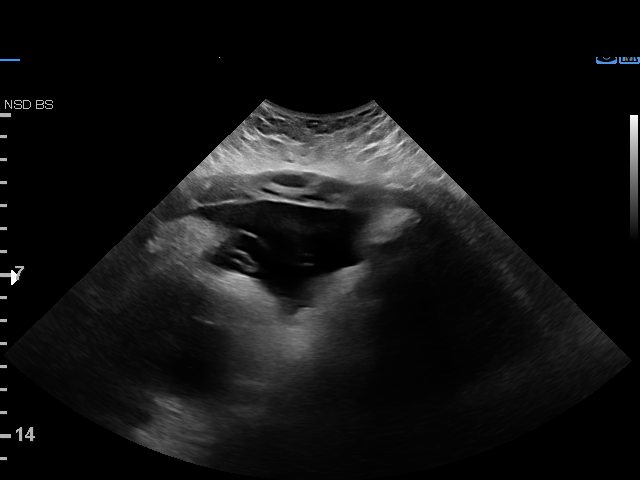
[im 11/12]
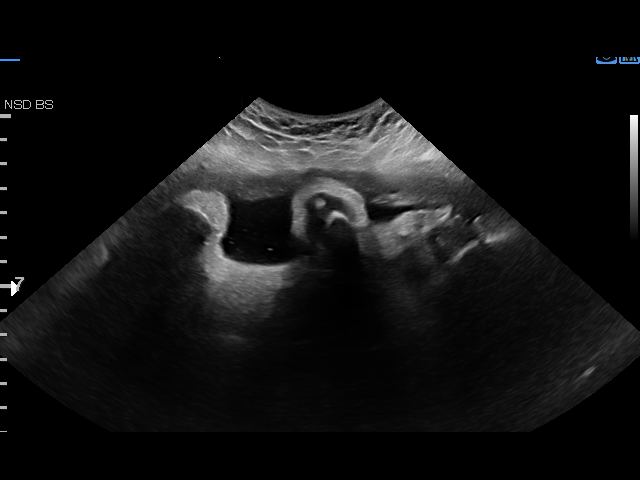
[im 12/12]
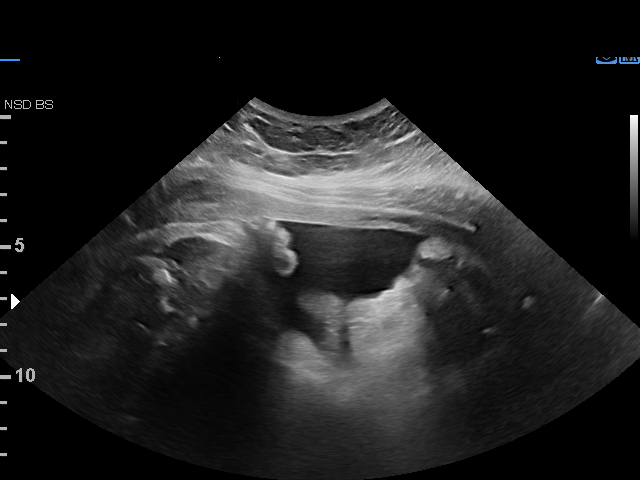

[12 of 12 positions shown; findings below may reference images not displayed]

Indications

 Obesity complicating pregnancy, second
 trimester (BMI 33)
 36 weeks gestation of pregnancy
 Gestational diabetes in pregnancy,
 unspecified control
 Medical complication of pregnancy (Covid +
 Third trimester) started antenatal testing [REDACTED]
 Elevated blood pressure affecting pregnancy
 in third trimester (No meds)
Fetal Evaluation

 Num Of Fetuses:         1
 Fetal Heart Rate(bpm):  140
 Cardiac Activity:       Observed
 Presentation:           Cephalic

 Amniotic Fluid
 AFI FV:      Within normal limits

 AFI Sum(cm)     %Tile       Largest Pocket(cm)
 14.61           54

 RUQ(cm)       RLQ(cm)       LUQ(cm)        LLQ(cm)

Biophysical Evaluation

 Amniotic F.V:   Within normal limits       F. Tone:        Observed
 F. Movement:    Observed                   Score:          [DATE]
 F. Breathing:   Observed
OB History

 Blood Type:   A+
 Gravidity:    2          SAB:   1
 Living:       0
Gestational Age

 LMP:           36w 5d        Date:  [DATE]                 EDD:   08/26/19
 Best:          36w 5d     Det. By:  U/S C R L  (06/01/20)    EDD:   10/21/19
Impression

 Gestational diabetes.  Patient reports she is not checking her
 blood glucose regularly.  Random blood glucose
 measurement was 90 mg/DL at her prenatal visit.
 Transient hypertension (?  Chronic hypertension).  Blood
 pressure today at her office is 139/64 mmHg.
 Patient had Covid infection in the third trimester.

 Amniotic fluid is normal and good fetal activity is seen
 .Antenatal testing is reassuring. BPP 06/01/20.

 I discussed the importance of good control of diabetes to
 prevent adverse fetal or neonatal outcomes.

 If control of diabetes is uncertain or if the patient is not
 checking her blood glucose regularly, delivery at 38 weeks
 gestation is reasonable given that she has a diagnosis of
 hypertension and recent Covid infection.

 Patient does not want to be delivered at 38 weeks gestation.
Recommendations

 -Continue weekly BPP till delivery.
                 Bustos, Rub[place]

## 2022-05-29 ENCOUNTER — Emergency Department (HOSPITAL_COMMUNITY)
Admission: EM | Admit: 2022-05-29 | Discharge: 2022-05-30 | Discharge status: Against Medical Advice | Payer: BC Managed Care – PPO | Attending: Student | Admitting: Student

## 2022-05-29 ENCOUNTER — Other Ambulatory Visit: Payer: Self-pay

## 2022-05-29 ENCOUNTER — Encounter (HOSPITAL_COMMUNITY): Payer: Self-pay

## 2022-05-29 DIAGNOSIS — J029 Acute pharyngitis, unspecified: Secondary | ICD-10-CM | POA: Diagnosis not present

## 2022-05-29 DIAGNOSIS — Z5321 Procedure and treatment not carried out due to patient leaving prior to being seen by health care provider: Secondary | ICD-10-CM | POA: Diagnosis not present

## 2022-05-29 LAB — GROUP A STREP BY PCR: Group A Strep by PCR: NOT DETECTED

## 2022-05-29 NOTE — ED Provider Triage Note (Signed)
Emergency Medicine Provider Triage Evaluation Note  Cindy Fletcher , a 27 y.o. female  was evaluated in triage.  Patient complains of pain to her throat.  Localizes it to below her tongue.  Has been going on since Saturday.  Review of Systems  Positive:  Negative:   Physical Exam  BP (!) 153/112 (BP Location: Right Arm)   Pulse 91   Temp 98.3 F (36.8 C)   Resp 17   Ht '5\' 8"'$  (1.727 m)   Wt 113.4 kg   SpO2 100%   BMI 38.01 kg/m  Gen:   Awake, no distress   Resp:  Normal effort  MSK:   Moves extremities without difficulty  Other:  No swelling in the oropharynx.  No exudate.  No palpable lymphadenopathy.  Medical Decision Making  Medically screening exam initiated at 9:46 PM.  Appropriate orders placed.  Jonnae Murai was informed that the remainder of the evaluation will be completed by another provider, this initial triage assessment does not replace that evaluation, and the importance of remaining in the ED until their evaluation is complete.     Rhae Hammock, [place] 05/29/22 2147

## 2022-05-29 NOTE — ED Triage Notes (Signed)
Anterior sore throat and difficulty swallowing x 5 days.

## 2022-05-30 NOTE — ED Notes (Signed)
Pt left [facility] due to long wait times.  

## 2022-09-17 DIAGNOSIS — Z113 Encounter for screening for infections with a predominantly sexual mode of transmission: Secondary | ICD-10-CM | POA: Diagnosis not present

## 2022-09-17 DIAGNOSIS — Z1331 Encounter for screening for depression: Secondary | ICD-10-CM | POA: Diagnosis not present

## 2022-09-17 DIAGNOSIS — Z0000 Encounter for general adult medical examination without abnormal findings: Secondary | ICD-10-CM | POA: Diagnosis not present

## 2022-09-17 DIAGNOSIS — Z6839 Body mass index (BMI) 39.0-39.9, adult: Secondary | ICD-10-CM | POA: Diagnosis not present

## 2022-09-24 DIAGNOSIS — R7303 Prediabetes: Secondary | ICD-10-CM | POA: Diagnosis not present

## 2022-09-24 DIAGNOSIS — Z6841 Body Mass Index (BMI) 40.0 and over, adult: Secondary | ICD-10-CM | POA: Diagnosis not present

## 2022-10-22 DIAGNOSIS — R7303 Prediabetes: Secondary | ICD-10-CM | POA: Diagnosis not present

## 2022-10-22 DIAGNOSIS — L700 Acne vulgaris: Secondary | ICD-10-CM | POA: Diagnosis not present

## 2022-10-22 DIAGNOSIS — Z6837 Body mass index (BMI) 37.0-37.9, adult: Secondary | ICD-10-CM | POA: Diagnosis not present

## 2022-11-20 DIAGNOSIS — Z6835 Body mass index (BMI) 35.0-35.9, adult: Secondary | ICD-10-CM | POA: Diagnosis not present

## 2022-11-20 DIAGNOSIS — L700 Acne vulgaris: Secondary | ICD-10-CM | POA: Diagnosis not present

## 2022-11-20 DIAGNOSIS — R7303 Prediabetes: Secondary | ICD-10-CM | POA: Diagnosis not present

## 2023-01-20 DIAGNOSIS — E6609 Other obesity due to excess calories: Secondary | ICD-10-CM | POA: Diagnosis not present

## 2023-01-20 DIAGNOSIS — E66811 Obesity, class 1: Secondary | ICD-10-CM | POA: Diagnosis not present

## 2023-01-20 DIAGNOSIS — Z6834 Body mass index (BMI) 34.0-34.9, adult: Secondary | ICD-10-CM | POA: Diagnosis not present

## 2023-03-02 DIAGNOSIS — M25561 Pain in right knee: Secondary | ICD-10-CM | POA: Diagnosis not present

## 2023-03-02 DIAGNOSIS — E6609 Other obesity due to excess calories: Secondary | ICD-10-CM | POA: Diagnosis not present

## 2023-03-02 DIAGNOSIS — E66811 Obesity, class 1: Secondary | ICD-10-CM | POA: Diagnosis not present

## 2023-03-02 DIAGNOSIS — Z6832 Body mass index (BMI) 32.0-32.9, adult: Secondary | ICD-10-CM | POA: Diagnosis not present

## 2023-03-05 DIAGNOSIS — R52 Pain, unspecified: Secondary | ICD-10-CM | POA: Diagnosis not present

## 2023-03-05 DIAGNOSIS — M7651 Patellar tendinitis, right knee: Secondary | ICD-10-CM | POA: Diagnosis not present

## 2023-03-05 DIAGNOSIS — M25561 Pain in right knee: Secondary | ICD-10-CM | POA: Diagnosis not present

## 2023-04-28 IMAGING — US US MFM FETAL BPP W/O NON-STRESS
1 series · 14 of 28 positions shown · non-contrast
Comparison: none

[Series 1: us mfm fetal bpp w/o non-stress · 31 acquisitions, 14 frames shown]
[im 2/31]
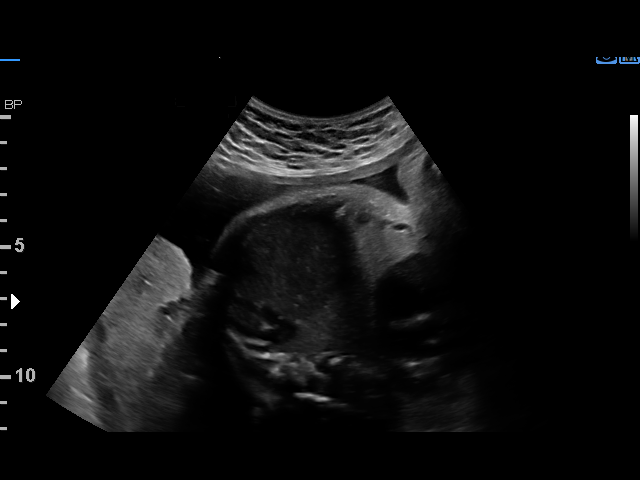
[im 4/31]
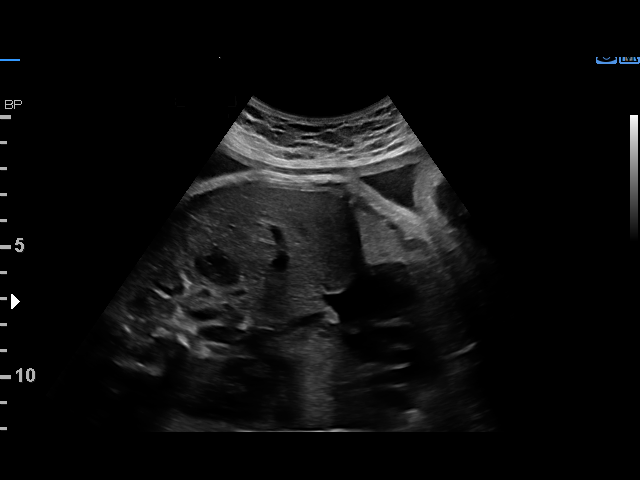
[im 6/31]
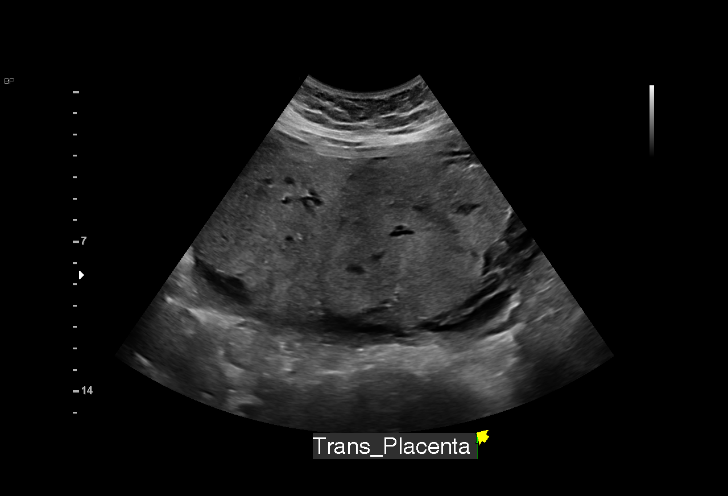
[im 8/31]
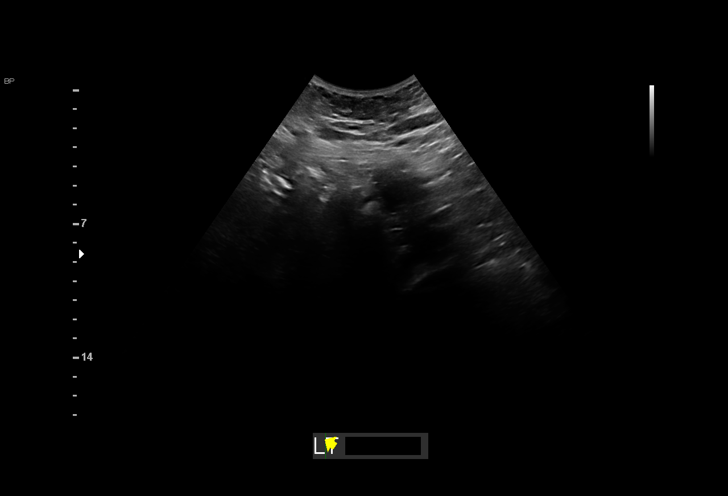
[im 11/31]
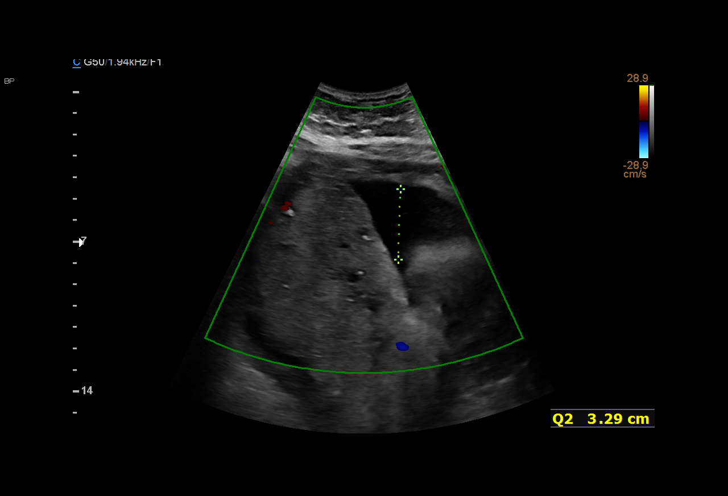
[im 13/31]
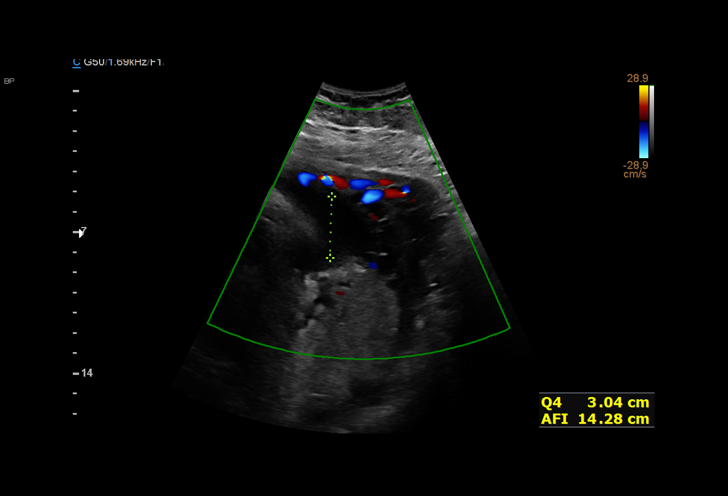
[im 15/31]
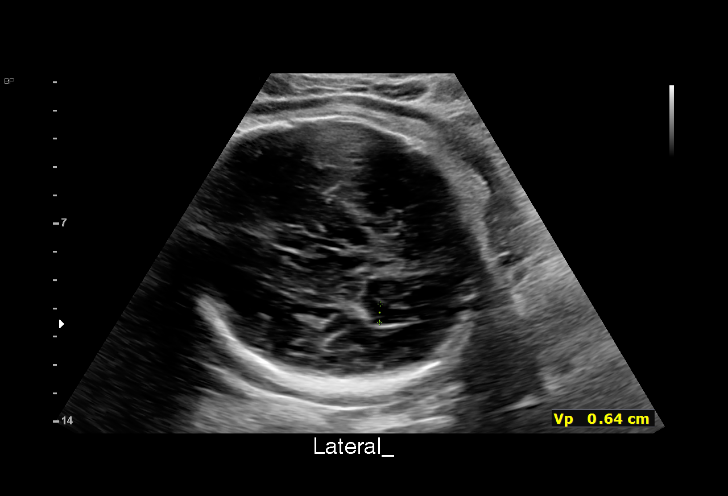
[im 17/31]
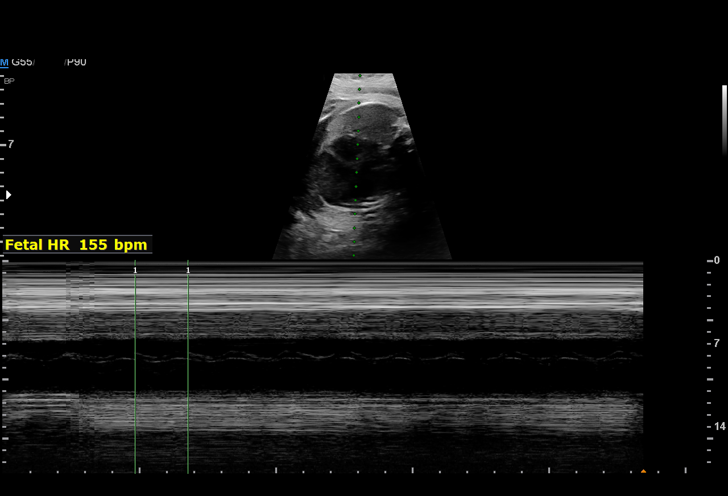
[im 19/31]
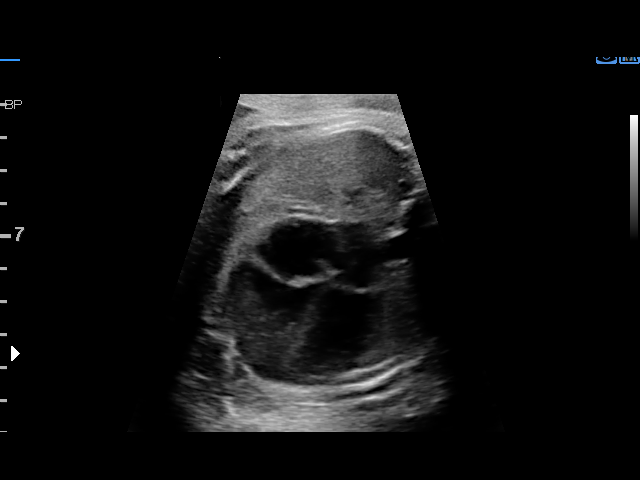
[im 22/31]
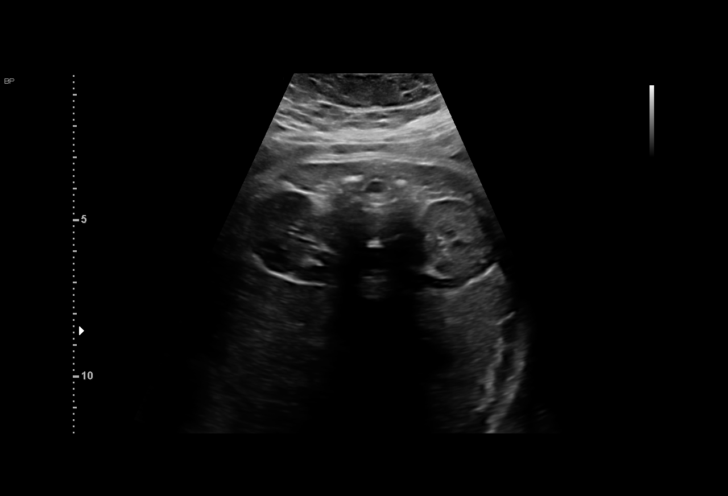
[im 24/31]
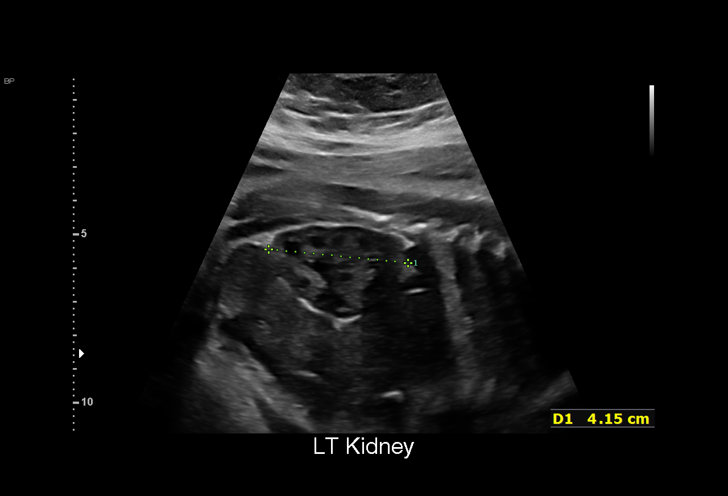
[im 26/31]
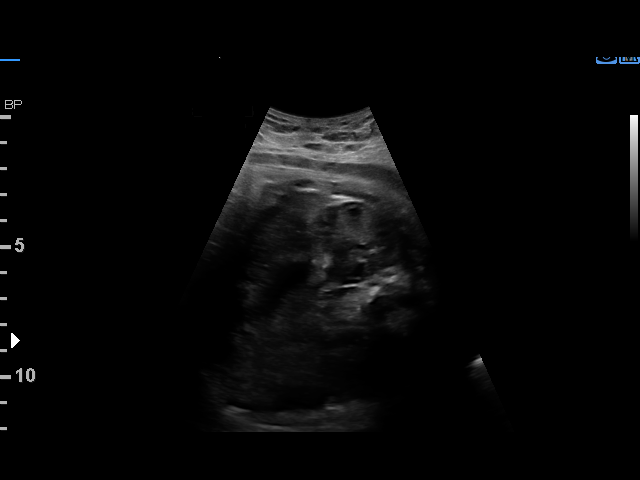
[im 28/31]
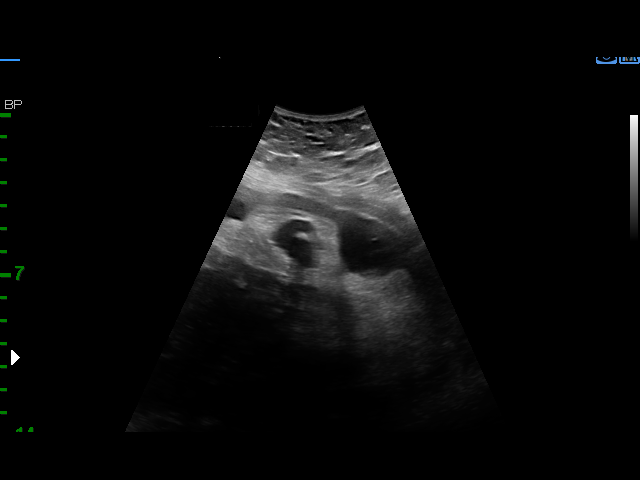
[im 31/31]
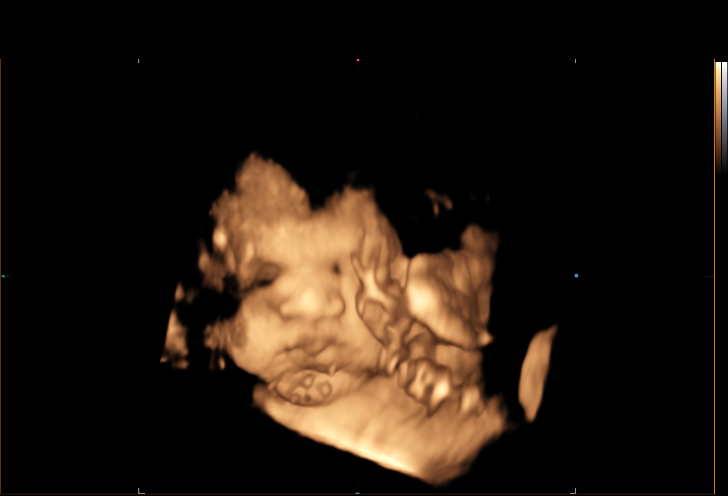

[14 of 28 positions shown; findings below may reference images not displayed]

[REDACTED]care

Indications

 Gestational diabetes in pregnancy, diet
 controlled
 Hypertension - Chronic/Pre-existing(No
 Meds)
 Obesity complicating pregnancy, third
 trimester (BMI 36)
 Large for gestational age fetus affecting
 management of mother
 Short interval between pregancies, 3rd
 trimester (Delivered 05/20/20)
 Poor obstetric history: Previous gestational
 diabetes
 Asthma                                         577.27 [id number]
 Low Risk NIPS(Negative Horizon)
 Anemia during pregnancy in third trimester
 34 weeks gestation of pregnancy
Fetal Evaluation

 Num Of Fetuses:         1
 Fetal Heart Rate(bpm):  155
 Cardiac Activity:       Observed
 Presentation:           Cephalic
 Placenta:               Posterior
 P. Cord Insertion:      Previously Visualized
 Amniotic Fluid
 AFI FV:      Within normal limits

 AFI Sum(cm)     %Tile       Largest Pocket(cm)
 13.65           47

 RUQ(cm)       RLQ(cm)       LUQ(cm)        LLQ(cm)

Biophysical Evaluation

 Amniotic F.V:   Pocket => 2 cm             F. Tone:        Observed
 F. Movement:    Observed                   Score:          [DATE]
 F. Breathing:   Observed
Biometry

 LV:        6.4  mm
OB History

 Gravidity:    3         Term:   1        Prem:   0        SAB:   1
 TOP:          0       Ectopic:  0        Living: 1
Gestational Age

 LMP:           36w 4d        Date:  08/30/20                 EDD:   06/06/21
 Best:          34w 4d     Det. By:  U/S C R L  (10/31/20)    EDD:   06/20/21
Anatomy

 Cranium:               Appears normal         LVOT:                   Previously seen
 Cavum:                 Appears normal         Aortic Arch:            Previously seen
 Ventricles:            Appears normal         Ductal Arch:            Previously seen
 Choroid Plexus:        Previously seen        Diaphragm:              Appears normal
 Cerebellum:            Previously seen        Stomach:                Appears normal, left
                                                                       sided
 Posterior Fossa:       Previously seen        Abdomen:                Previously seen
 Nuchal Fold:           Not applicable (>20    Abdominal Wall:         Previously seen
                        wks GA)
 Face:                  Not well visualized    Cord Vessels:           Appears normal (3
                                                                       vessel cord)
 Lips:                  Previously seen        Kidneys:                Appear normal
 Palate:                Not well visualized    Bladder:                Appears normal
 Thoracic:              Appears normal         Spine:                  Previously seen
 Heart:                 Appears normal         Upper Extremities:      Previously seen
                        (4CH, axis, and
                        situs)
 RVOT:                  Previously seen        Lower Extremities:      Previously seen

 Other:  Male gender previously seen. Technically difficult due to maternal
         habitus and fetal position.
Cervix Uterus Adnexa

 Cervix
 Not visualized (advanced GA >20wks)
 Uterus
 Normal shape and size.

 Right Ovary
 Not visualized.

 Left Ovary
 Not visualized.

 Cul De Sac
 No free fluid seen.

 Adnexa
 No adnexal mass visualized.
Impression

 Gestational diabetes.  Irregular self monitoring of blood
 glucose measurements.
 Chronic pretension.  Patient does not take antihypertensives.
 Her blood pressure today at her office is 135/63 mmHg.

 Amniotic fluid is normal and good fetal activity seen.
 Antenatal testing is reassuring.  BPP [DATE].  Cephalic
 presentation.

 I reassured the patient of the findings.  If diabetic control is
 uncertain, we recommend delivery at 37 or 38 weeks
 gestation.
Recommendations

 -Continue weekly BPP till delivery.
                 Merugu, Sh[place]op

## 2023-05-05 IMAGING — US US MFM FETAL BPP W/O NON-STRESS
1 series · 14 of 28 positions shown · non-contrast
Comparison: none

[Series 1: us mfm fetal bpp w/o non-stress · 39 acquisitions, 14 frames shown]
[im 2/39]
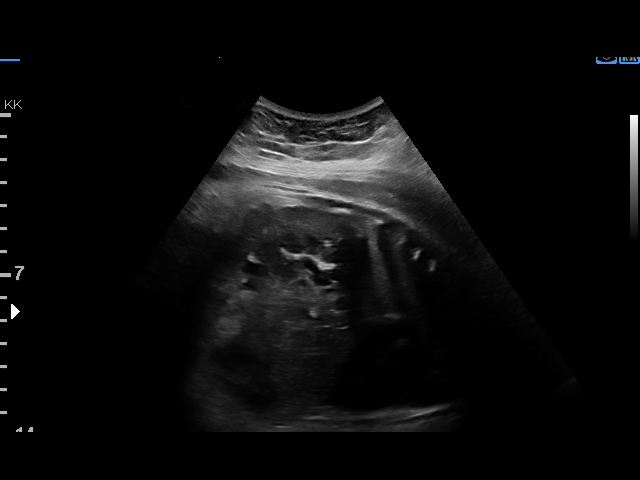
[im 5/39]
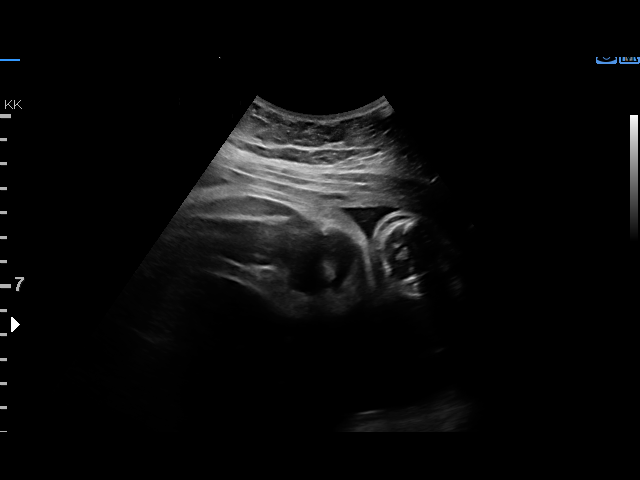
[im 8/39]
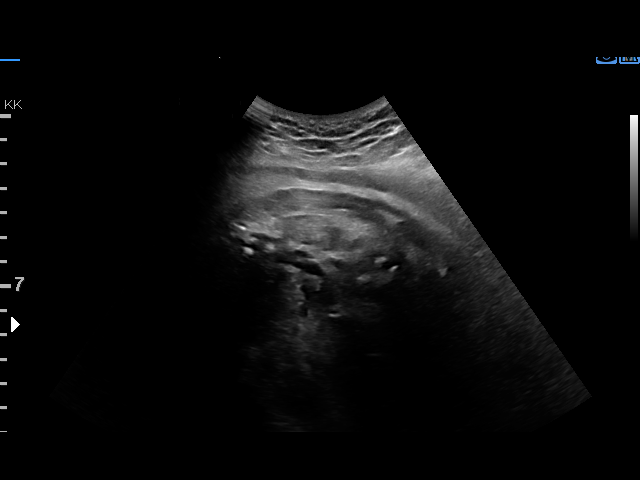
[im 10/39]
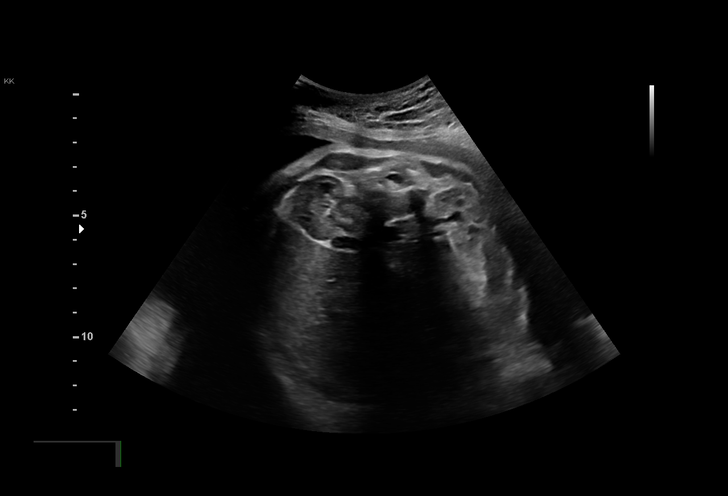
[im 13/39]
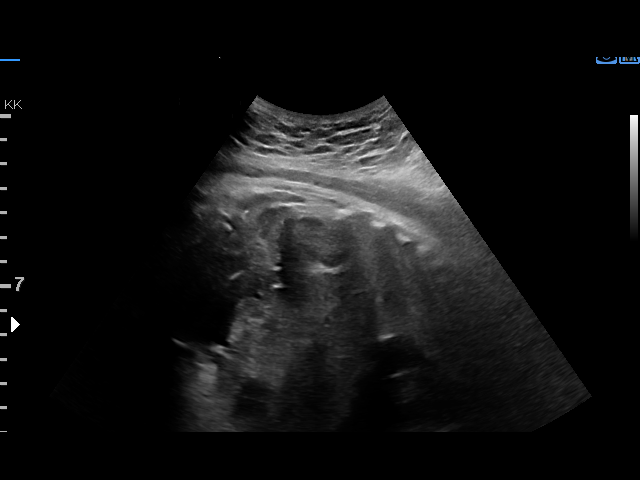
[im 16/39]
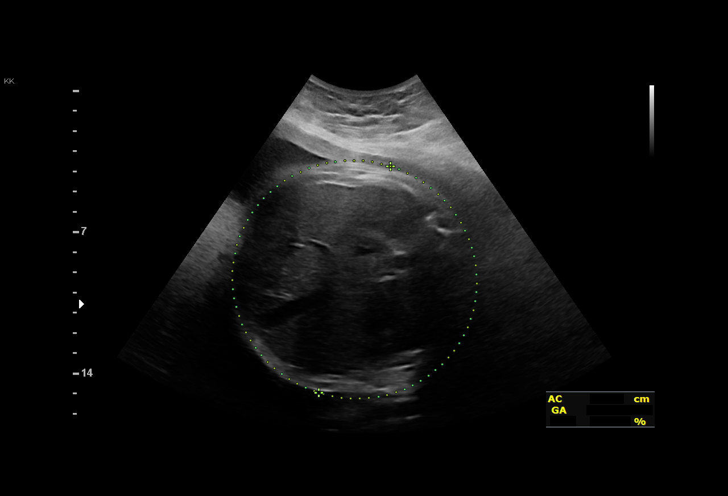
[im 19/39]
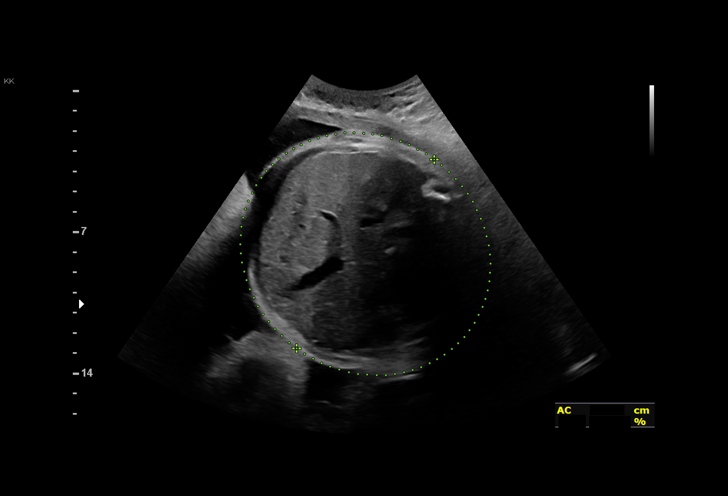
[im 22/39]
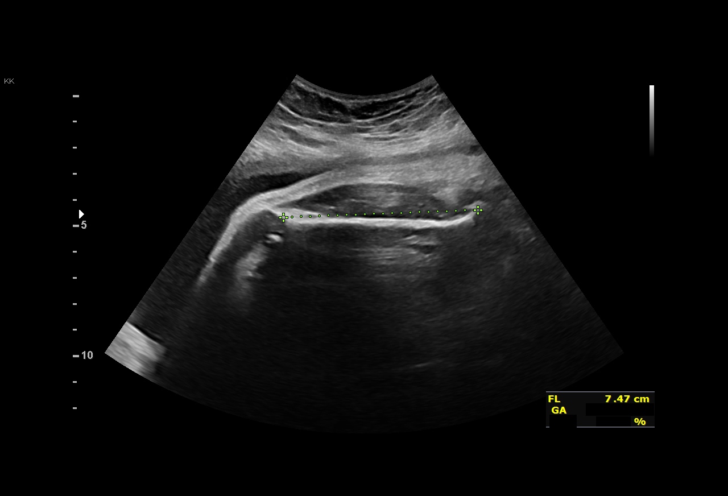
[im 24/39]
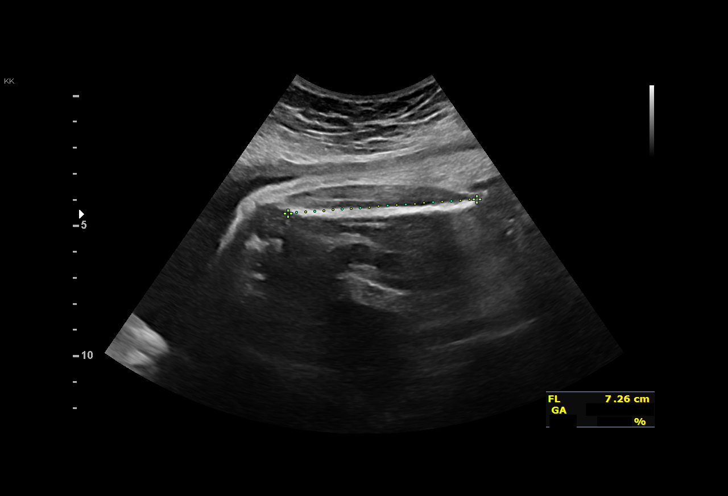
[im 27/39]
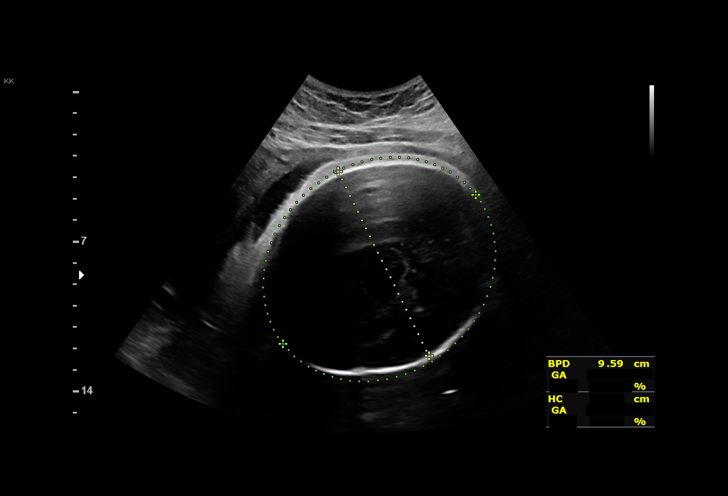
[im 30/39]
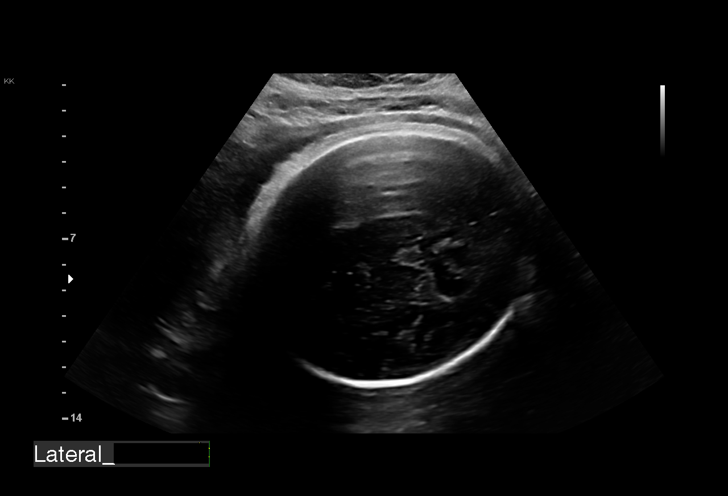
[im 33/39]
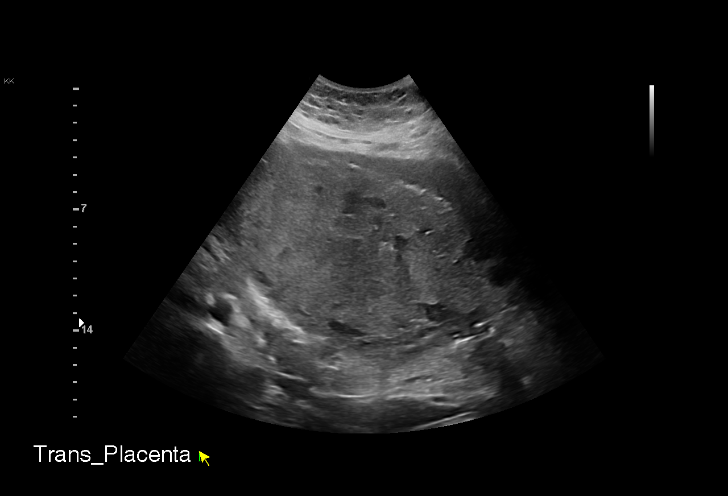
[im 36/39]
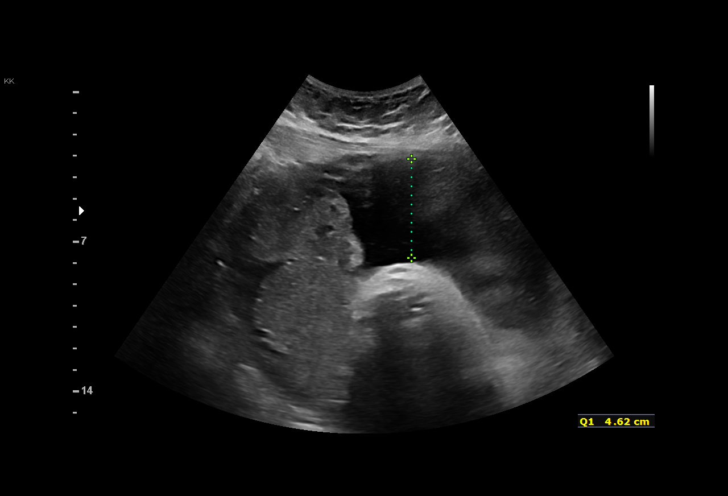
[im 39/39]
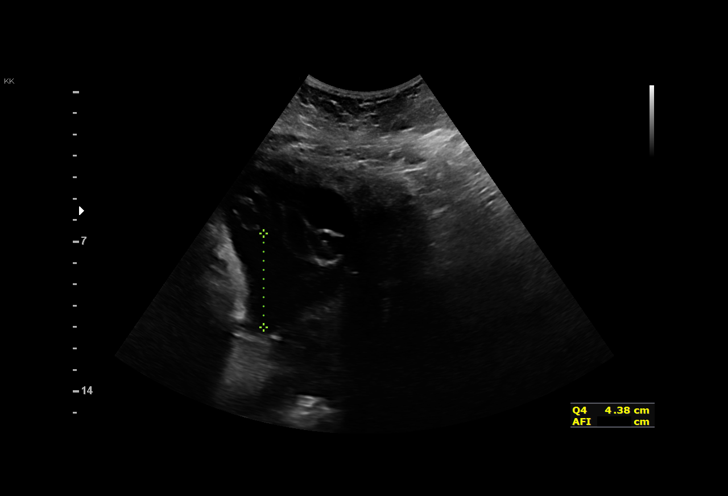

[14 of 28 positions shown; findings below may reference images not displayed]

[REDACTED]care

Indications

 Gestational diabetes in pregnancy,
 unspecified control
 Hypertension - Chronic/Pre-existing(No
 Meds)
 Obesity complicating pregnancy, third
 trimester (BMI 36)
 Large for gestational age fetus affecting
 management of mother
 Short interval between pregancies, 3rd
 trimester (Delivered 05/20/20)
 35 weeks gestation of pregnancy
 Poor obstetric history: Previous gestational
 diabetes
 Asthma                                         K66.H6 [id number]
 Low Risk NIPS(Negative Horizon)
 Anemia during pregnancy in third trimester
Fetal Evaluation

 Num Of Fetuses:         1
 Fetal Heart Rate(bpm):  150
 Cardiac Activity:       Observed
 Presentation:           Cephalic
 Placenta:               Posterior
 P. Cord Insertion:      Previously Visualized
 Amniotic Fluid
 AFI FV:      Within normal limits

 AFI Sum(cm)     %Tile       Largest Pocket(cm)
 17.7            66

 RUQ(cm)       RLQ(cm)       LUQ(cm)        LLQ(cm)

Biophysical Evaluation

 Amniotic F.V:   Pocket => 2 cm             F. Tone:        Observed
 F. Movement:    Observed                   Score:          [DATE]
 F. Breathing:   Observed
Biometry

 BPD:      96.6  mm     G. Age:  39w 3d       > 99  %    CI:        82.72   %    70 - 86
                                                         FL/HC:      21.9   %    20.1 -
 HC:       335   mm     G. Age:  38w 2d         83  %    HC/AC:      0.88        0.93 -
 AC:      380.6  mm     G. Age:  42w 0d       > 99  %    FL/BPD:     76.1   %    71 - 87
 FL:       73.5  mm     G. Age:  37w 4d         89  %    FL/AC:      19.3   %    20 - 24

 Est. FW:    1531  gm           9 lb   > 99  %
OB History

 Gravidity:    3         Term:   1        Prem:   0        SAB:   1
 TOP:          0       Ectopic:  0        Living: 1
Gestational Age

 LMP:           37w 4d        Date:  08/30/20                 EDD:   06/06/21
 U/S Today:     39w 2d                                        EDD:   05/25/21
 Best:          35w 4d     Det. By:  U/S C R L  (10/31/20)    EDD:   06/20/21
Anatomy

 Cranium:               Appears normal         LVOT:                   Previously seen
 Cavum:                 Previously seen        Aortic Arch:            Previously seen
 Ventricles:            Appears normal         Ductal Arch:            Previously seen
 Choroid Plexus:        Previously seen        Diaphragm:              Previously seen
 Cerebellum:            Previously seen        Stomach:                Appears normal, left
                                                                       sided
 Posterior Fossa:       Previously seen        Abdomen:                Previously seen
 Nuchal Fold:           Not applicable (>20    Abdominal Wall:         Previously seen
                        wks GA)
 Face:                  Not well visualized    Cord Vessels:           Previously seen
 Lips:                  Previously seen        Kidneys:                Appear normal
 Palate:                Not well visualized    Bladder:                Appears normal
 Thoracic:              Appears normal         Spine:                  Previously seen
 Heart:                 Previously seen        Upper Extremities:      Previously seen
 RVOT:                  Previously seen        Lower Extremities:      Previously seen

 Other:  Male gender previously seen. Technically difficult due to maternal
         habitus and fetal position.
Cervix Uterus Adnexa
 Cervix
 Not visualized (advanced GA >12wks)
Impression

 Gestational diabetes.  Patient has not been checking her
 blood glucose regularly and reports some abnormal levels.
 She has not brought her blood glucose log today.

 On today's ultrasound, the estimated fetal weight and
 abdominal circumference measurements are at the 99
 percentile.  Amniotic fluid is normal and good fetal activity
 seen.  Antenatal testing is reassuring.  BPP [DATE]

 Poor control of diabetes can lead to complications including
 stillbirth.  I discussed timing of delivery.  It is reasonable to
 consider delivery at 37 weeks gestation to prevent adverse
 outcomes.
Recommendations

 - BPP next week
 Consider delivery at 37 weeks gestation.
                 Aujla, Lesya

## 2023-06-16 DEATH — deceased
